# Patient Record
Sex: Female | Born: 1950 | Race: White | Hispanic: No | Marital: Married | Smoking: Never smoker
Health system: Southern US, Community
[De-identification: ages and names within clinical notes are randomized; demographics above are authoritative.]

## PROBLEM LIST (undated history)

## (undated) DIAGNOSIS — G319 Degenerative disease of nervous system, unspecified: Secondary | ICD-10-CM

## (undated) DIAGNOSIS — H9190 Unspecified hearing loss, unspecified ear: Secondary | ICD-10-CM

## (undated) DIAGNOSIS — I728 Aneurysm of other specified arteries: Secondary | ICD-10-CM

## (undated) DIAGNOSIS — R413 Other amnesia: Secondary | ICD-10-CM

## (undated) DIAGNOSIS — Z87448 Personal history of other diseases of urinary system: Secondary | ICD-10-CM

## (undated) DIAGNOSIS — E7800 Pure hypercholesterolemia, unspecified: Secondary | ICD-10-CM

## (undated) DIAGNOSIS — H409 Unspecified glaucoma: Secondary | ICD-10-CM

## (undated) DIAGNOSIS — M8580 Other specified disorders of bone density and structure, unspecified site: Secondary | ICD-10-CM

## (undated) DIAGNOSIS — F419 Anxiety disorder, unspecified: Secondary | ICD-10-CM

## (undated) DIAGNOSIS — E559 Vitamin D deficiency, unspecified: Secondary | ICD-10-CM

## (undated) HISTORY — DX: Pure hypercholesterolemia, unspecified: E78.00

## (undated) HISTORY — PX: BREAST CYST ASPIRATION: SHX578

## (undated) HISTORY — DX: Anxiety disorder, unspecified: F41.9

## (undated) HISTORY — DX: Aneurysm of other specified arteries: I72.8

## (undated) HISTORY — DX: Other specified disorders of bone density and structure, unspecified site: M85.80

## (undated) HISTORY — PX: FOOT SURGERY: SHX648

## (undated) HISTORY — DX: Other amnesia: R41.3

## (undated) HISTORY — DX: Personal history of other diseases of urinary system: Z87.448

## (undated) HISTORY — DX: Vitamin D deficiency, unspecified: E55.9

## (undated) HISTORY — DX: Unspecified hearing loss, unspecified ear: H91.90

## (undated) HISTORY — DX: Degenerative disease of nervous system, unspecified: G31.9

## (undated) HISTORY — DX: Unspecified glaucoma: H40.9

---

## 1993-04-27 DIAGNOSIS — Z87448 Personal history of other diseases of urinary system: Secondary | ICD-10-CM

## 1993-04-27 HISTORY — DX: Personal history of other diseases of urinary system: Z87.448

## 1996-02-26 HISTORY — PX: BREAST BIOPSY: SHX20

## 1998-06-30 ENCOUNTER — Ambulatory Visit (HOSPITAL_COMMUNITY): Admission: RE | Admit: 1998-06-30 | Discharge: 1998-06-30 | Payer: Self-pay | Admitting: Obstetrics and Gynecology

## 1999-07-06 ENCOUNTER — Encounter: Payer: Self-pay | Admitting: Obstetrics and Gynecology

## 1999-07-06 ENCOUNTER — Ambulatory Visit (HOSPITAL_COMMUNITY): Admission: RE | Admit: 1999-07-06 | Discharge: 1999-07-06 | Payer: Self-pay | Admitting: Obstetrics and Gynecology

## 1999-11-23 ENCOUNTER — Other Ambulatory Visit: Admission: RE | Admit: 1999-11-23 | Discharge: 1999-11-23 | Payer: Self-pay | Admitting: Obstetrics and Gynecology

## 2000-07-25 ENCOUNTER — Encounter: Payer: Self-pay | Admitting: Obstetrics and Gynecology

## 2000-07-25 ENCOUNTER — Ambulatory Visit (HOSPITAL_COMMUNITY): Admission: RE | Admit: 2000-07-25 | Discharge: 2000-07-25 | Payer: Self-pay | Admitting: Obstetrics and Gynecology

## 2000-10-17 ENCOUNTER — Other Ambulatory Visit: Admission: RE | Admit: 2000-10-17 | Discharge: 2000-10-17 | Payer: Self-pay | Admitting: Obstetrics and Gynecology

## 2001-07-31 ENCOUNTER — Ambulatory Visit (HOSPITAL_COMMUNITY): Admission: RE | Admit: 2001-07-31 | Discharge: 2001-07-31 | Payer: Self-pay | Admitting: Obstetrics and Gynecology

## 2001-07-31 ENCOUNTER — Encounter: Payer: Self-pay | Admitting: Obstetrics and Gynecology

## 2001-10-09 ENCOUNTER — Other Ambulatory Visit: Admission: RE | Admit: 2001-10-09 | Discharge: 2001-10-09 | Payer: Self-pay | Admitting: Obstetrics and Gynecology

## 2002-07-30 ENCOUNTER — Ambulatory Visit (HOSPITAL_COMMUNITY): Admission: RE | Admit: 2002-07-30 | Discharge: 2002-07-30 | Payer: Self-pay | Admitting: Obstetrics and Gynecology

## 2002-07-30 ENCOUNTER — Encounter: Payer: Self-pay | Admitting: Obstetrics and Gynecology

## 2002-10-08 ENCOUNTER — Other Ambulatory Visit: Admission: RE | Admit: 2002-10-08 | Discharge: 2002-10-08 | Payer: Self-pay | Admitting: Obstetrics and Gynecology

## 2003-08-12 ENCOUNTER — Encounter: Payer: Self-pay | Admitting: Obstetrics and Gynecology

## 2003-08-12 ENCOUNTER — Ambulatory Visit (HOSPITAL_COMMUNITY): Admission: RE | Admit: 2003-08-12 | Discharge: 2003-08-12 | Payer: Self-pay | Admitting: Obstetrics and Gynecology

## 2003-10-14 ENCOUNTER — Other Ambulatory Visit: Admission: RE | Admit: 2003-10-14 | Discharge: 2003-10-14 | Payer: Self-pay | Admitting: Obstetrics and Gynecology

## 2004-08-17 ENCOUNTER — Ambulatory Visit (HOSPITAL_COMMUNITY): Admission: RE | Admit: 2004-08-17 | Discharge: 2004-08-17 | Payer: Self-pay | Admitting: Obstetrics and Gynecology

## 2004-10-19 ENCOUNTER — Other Ambulatory Visit: Admission: RE | Admit: 2004-10-19 | Discharge: 2004-10-19 | Payer: Self-pay | Admitting: *Deleted

## 2005-10-10 ENCOUNTER — Encounter: Admission: RE | Admit: 2005-10-10 | Discharge: 2005-10-10 | Payer: Self-pay | Admitting: Orthopedic Surgery

## 2006-01-10 ENCOUNTER — Other Ambulatory Visit: Admission: RE | Admit: 2006-01-10 | Discharge: 2006-01-10 | Payer: Self-pay | Admitting: Obstetrics and Gynecology

## 2006-05-15 ENCOUNTER — Ambulatory Visit (HOSPITAL_COMMUNITY): Admission: RE | Admit: 2006-05-15 | Discharge: 2006-05-15 | Payer: Self-pay | Admitting: Obstetrics and Gynecology

## 2007-01-23 ENCOUNTER — Other Ambulatory Visit: Admission: RE | Admit: 2007-01-23 | Discharge: 2007-01-23 | Payer: Self-pay | Admitting: Obstetrics and Gynecology

## 2007-06-26 ENCOUNTER — Encounter: Admission: RE | Admit: 2007-06-26 | Discharge: 2007-06-26 | Payer: Self-pay | Admitting: Obstetrics and Gynecology

## 2007-10-28 ENCOUNTER — Ambulatory Visit (HOSPITAL_BASED_OUTPATIENT_CLINIC_OR_DEPARTMENT_OTHER): Admission: RE | Admit: 2007-10-28 | Discharge: 2007-10-28 | Payer: Self-pay | Admitting: Orthopedic Surgery

## 2008-01-29 ENCOUNTER — Other Ambulatory Visit: Admission: RE | Admit: 2008-01-29 | Discharge: 2008-01-29 | Payer: Self-pay | Admitting: Obstetrics and Gynecology

## 2008-01-29 ENCOUNTER — Encounter: Admission: RE | Admit: 2008-01-29 | Discharge: 2008-01-29 | Payer: Self-pay | Admitting: Family Medicine

## 2008-07-01 ENCOUNTER — Encounter: Admission: RE | Admit: 2008-07-01 | Discharge: 2008-07-01 | Payer: Self-pay | Admitting: Obstetrics and Gynecology

## 2009-02-03 ENCOUNTER — Other Ambulatory Visit: Admission: RE | Admit: 2009-02-03 | Discharge: 2009-02-03 | Payer: Self-pay | Admitting: Obstetrics and Gynecology

## 2009-07-21 ENCOUNTER — Encounter: Admission: RE | Admit: 2009-07-21 | Discharge: 2009-07-21 | Payer: Self-pay | Admitting: Obstetrics and Gynecology

## 2009-09-29 ENCOUNTER — Encounter: Admission: RE | Admit: 2009-09-29 | Discharge: 2009-09-29 | Payer: Self-pay | Admitting: Specialist

## 2009-10-28 DIAGNOSIS — H9190 Unspecified hearing loss, unspecified ear: Secondary | ICD-10-CM

## 2009-10-28 HISTORY — DX: Unspecified hearing loss, unspecified ear: H91.90

## 2009-12-26 HISTORY — PX: TOTAL KNEE ARTHROPLASTY: SHX125

## 2009-12-29 ENCOUNTER — Encounter (INDEPENDENT_AMBULATORY_CARE_PROVIDER_SITE_OTHER): Payer: Self-pay | Admitting: Specialist

## 2009-12-29 ENCOUNTER — Inpatient Hospital Stay (HOSPITAL_COMMUNITY): Admission: RE | Admit: 2009-12-29 | Discharge: 2010-01-02 | Payer: Self-pay | Admitting: Specialist

## 2010-07-27 ENCOUNTER — Encounter: Admission: RE | Admit: 2010-07-27 | Discharge: 2010-07-27 | Payer: Self-pay | Admitting: Obstetrics and Gynecology

## 2010-11-17 ENCOUNTER — Encounter: Payer: Self-pay | Admitting: Obstetrics and Gynecology

## 2011-01-21 LAB — CROSSMATCH

## 2011-01-21 LAB — DIFFERENTIAL
Basophils Relative: 0 % (ref 0–1)
Eosinophils Relative: 1 % (ref 0–5)
Lymphocytes Relative: 34 % (ref 12–46)
Lymphs Abs: 2 10*3/uL (ref 0.7–4.0)
Monocytes Relative: 7 % (ref 3–12)
Neutro Abs: 3.4 10*3/uL (ref 1.7–7.7)

## 2011-01-21 LAB — CBC
HCT: 40 % (ref 36.0–46.0)
Hemoglobin: 10.4 g/dL — ABNORMAL LOW (ref 12.0–15.0)
Hemoglobin: 7.4 g/dL — ABNORMAL LOW (ref 12.0–15.0)
Hemoglobin: 8.8 g/dL — ABNORMAL LOW (ref 12.0–15.0)
Hemoglobin: 8.9 g/dL — ABNORMAL LOW (ref 12.0–15.0)
MCHC: 34.4 g/dL (ref 30.0–36.0)
MCV: 92.2 fL (ref 78.0–100.0)
MCV: 94.4 fL (ref 78.0–100.0)
MCV: 94.5 fL (ref 78.0–100.0)
MCV: 95.2 fL (ref 78.0–100.0)
MCV: 96.3 fL (ref 78.0–100.0)
Platelets: 148 10*3/uL — ABNORMAL LOW (ref 150–400)
Platelets: 162 10*3/uL (ref 150–400)
Platelets: 252 10*3/uL (ref 150–400)
RBC: 2.7 MIL/uL — ABNORMAL LOW (ref 3.87–5.11)
RBC: 3.24 MIL/uL — ABNORMAL LOW (ref 3.87–5.11)
RBC: 4.21 MIL/uL (ref 3.87–5.11)
RDW: 13.3 % (ref 11.5–15.5)
WBC: 5.9 10*3/uL (ref 4.0–10.5)
WBC: 8 10*3/uL (ref 4.0–10.5)
WBC: 8.4 10*3/uL (ref 4.0–10.5)

## 2011-01-21 LAB — BASIC METABOLIC PANEL
BUN: 17 mg/dL (ref 6–23)
BUN: 8 mg/dL (ref 6–23)
CO2: 29 mEq/L (ref 19–32)
Chloride: 100 mEq/L (ref 96–112)
Chloride: 104 mEq/L (ref 96–112)
GFR calc Af Amer: >60 mL/min (ref >60)
Glucose, Bld: 133 mg/dL — ABNORMAL HIGH (ref 70–99)
Potassium: 3.8 mEq/L (ref 3.5–5.1)
Potassium: 4.3 mEq/L (ref 3.5–5.1)
Sodium: 137 mEq/L (ref 135–145)

## 2011-01-21 LAB — COMPREHENSIVE METABOLIC PANEL
AST: 21 U/L (ref 0–37)
Albumin: 4.3 g/dL (ref 3.5–5.2)
Alkaline Phosphatase: 54 U/L (ref 39–117)
CO2: 30 mEq/L (ref 19–32)
Chloride: 105 mEq/L (ref 96–112)
Creatinine, Ser: 0.54 mg/dL (ref 0.4–1.2)
GFR calc Af Amer: >60 mL/min (ref >60)
GFR calc non Af Amer: >60 mL/min (ref >60)
Glucose, Bld: 98 mg/dL (ref 70–99)
Potassium: 5.4 mEq/L — ABNORMAL HIGH (ref 3.5–5.1)
Sodium: 141 mEq/L (ref 135–145)
Total Bilirubin: 0.6 mg/dL (ref 0.3–1.2)

## 2011-01-21 LAB — URINE MICROSCOPIC-ADD ON

## 2011-01-21 LAB — ABO/RH: ABO/RH(D): O NEG

## 2011-01-21 LAB — URINALYSIS, ROUTINE W REFLEX MICROSCOPIC: Nitrite: NEGATIVE

## 2011-03-12 NOTE — Op Note (Signed)
Rebecca [name], [name]                 ACCOUNT NO.:  [id number]   MEDICAL RECORD NO.:  [id number]          PATIENT TYPE:  AMB   LOCATION:  DSC                          FACILITY:  [facility]   PHYSICIAN:  Leonides Grills, M.D.     DATE OF BIRTH:  Jul 17, 1951   DATE OF PROCEDURE:  10/28/2007  DATE OF DISCHARGE:                               OPERATIVE REPORT   PREOPERATIVE DIAGNOSES:  1. Left first TMT joint arthritis.  2. Left tight gastroc.  3. Left second through fifth hammertoes.  4. Left second and third metatarsalgia.   POSTOPERATIVE DIAGNOSES:  1. Left first TMT joint arthritis.  2. Left tight gastroc.  3. Left second through fifth hammertoes.  4. Left second and third metatarsalgia.   OPERATION:  1. Left first TMT joint fusion.  2. Left local bone graft.  3. Stress x-rays left foot.  4. Left second and third Weil metatarsal shortening osteotomies.  5. Left gastroc slide.  6. Left second through fifth toes MTP joint dorsal capsulotomies with      collateral release.  7. Left second through fifth toes proximal phalanx head resection.  8. Left second through fifth toes FDL to proximal phalanx tendon      transfers.  9. Left second through fourth toes EDB to EDL tendon transfer.  10.Left fifth toe EDL  Z lengthening.   ANESTHESIA:  General.   SURGEON:  Leonides Grills MD.   ASSISTANT:  Evlyn Kanner, PA-C.   ESTIMATED BLOOD LOSS:  Minimal.   TOURNIQUET TIME:  Two hours.   COMPLICATIONS:  None.   DISPOSITION:  Stable to PR.   INDICATIONS:  This is a 60 year old female who has had rheumatoid  arthritis and longstanding left forefoot pain that is interfering with  her life to the point where she could not do what she wants to despite  orthotics and shoe modifications and pads.  She was consented for the  above procedure.  All risks which include infection, nerve or vessel  injury, nonunion, malunion, hardware rotation, hardware failure,  persistent pain, worse pain, prolonged  recovery, stiffness, arthritis of  neighboring joints were all explained, questions were encouraged and  answered.   OPERATION:  The patient was brought to the operating room, placed in  supine position after adequate general anesthesia was administered as  well as Ancef 1 gram IV piggyback.  The left lower extremity is then  prepped and draped in a sterile manner over a proximally placed thigh  tourniquet.  We started the procedure with a longitudinal incision over  the medial aspect of the gastrocnemius muscle tendinous junction.  Dissection was carried down through skin.  Hemostasis was obtained.  Fascia was opened in line with the incision.  Conjoined region was  developed to the gastroc and soleus.  Soft tissue was then elevated off  the posterior aspect of the gastrocnemius.  Sural nerves identified and  protected posteriorly.  The gastrocnemius was then released with a  curved Mayo scissors and __________  release tight gastroc.  The area  was copiously irrigated with normal saline.  Subcu was  closed with 3-0  Vicryl, skin was closed with 4-0 Monocryl subcuticular stitch.  Steri-  Strips were applied.  Limb was then gravity exsanguinated.  Tourniquet  was elevated to 290 mmHg.  A longitudinal incision midline over the  medial aspect of the left great toe MTP joint was then made.  Dissection  carried down through skin.  Hemostasis was obtained.  Neurovascular  structures were identified both superiorly and inferiorly and protected  throughout the case.  A simple longitudinal capsulotomy was then made.  The MTP joint was then entered.  There was minimal cartilage left. The  remaining cartilage remove with a curved corners osteotome curette.  Once this was done, the lateral capsule was then released with a curved  Beaver blade.  We were then able to reduce the toe in a more anatomical  position.  She had a previous Austin bunionectomy and Akin osteotomy  from her previous surgeries  and made alignment a little more difficult  but the surfaces were congruent.  We then placed multiple 2 mm drill  holes in either side of the joint.  Joint was then reduced to an  anatomic position of about 5 degrees of valgus and with the proximal  phalanx parallel to the plantar aspect of the weightbearing surface of  the foot.  This was verified under C-arm guidance in the AP and lateral  planes.  Once this was held in placed with a 2 mm K-wire, we then placed  a 3.5 mm fully threaded cortical lag screw using 3.5 and 2.5 mm drill  holes, respectively.  This had excellent purchase and maintenance of the  compression across the MTP joint.  We then removed the 2 K-wire and  replaced this with a 3.5 mm fully threaded cortical lag screw using a  3.5 and 2.5 mm holes, respectively.  This had excellent purchase and  maintenance of the correction, bone removed from spurs as well as the  drills and later on through the proximal phalanx head resections were  used as local bone graft and this was packed into the joint with a  stress strain relieving technique using a bur.  We then made a  longitudinal incision over the second toe dorsally.  Dissection was  carried down through skin.  Hemostasis was obtained.  EDB and EDL  tendons were identified.  The EDL tendon was tenotomized proximal medial  and brevis distal lateral and retracted out of harm's way.  An MTP joint  dorsal capsulotomy with collateral release was then performed with 15  blade scalpel, protecting the soft tissue both medial and laterally.  This had excellent release of the tight capsule dorsally.  We then  skeletonized the distal aspect of the proximal phalanx.  The head was  then removed with a rongeur followed by a bone cutter.  This cut was  made perpendicular to the long axis of the proximal phalanx.  We then  went back to the MTP joint, plantar flexed the toe exposing the second  metatarsal head.  We then performed a Weil  metatarsal shortening  osteotomy using stacked sagittal saw blades.  This cut was made parallel  to the plantar aspect of the foot.  The head was then translated  approximately 3-4 mm proximally and affixed with a 1.5 mm fully threaded  cortical set screw using 1.1 mm drill holes, respectively.  This was 12  mm in length and had excellent purchase and maintenance of the  correction.  Redundant bone ledge  dorsally was trimmed off with the  rongeur and the exposed bone surfaces were covered with bone wax.  The  area was copiously irrigated with normal saline.  Once this was done, we  went back to the PIP joint. A longitudinal incision was made in the  plantar plate.  The FDL tendon was then identified as distal as possible  and tenotomized.  We then made a drill hole using a 2.5 and 3.5 mm drill  in the base of the proximal phalanx.  We then transferred the FDL  through the drill hole from plantar to dorsal using 3-0 PDS stitches,  had excellent transfer.  We then placed a 0.045 K-wire antegrade through  middle and distal phalanx, reduced PIP joint and fired this retrograde  with the MTP joint held in reduced position and tension applied on the  FDL tendon through the drill hole with the ankle in neutral dorsiflexion  and the toe slightly plantar flex.  With the K-wire holding the toe in a  anatomic position, we then completed the EDB to EDL tendon transfer  dorsally using 3-0 PDS stitch.  This was also sewn to the stump of the  FDL tendon recreating the extensor expansion unit.  The same exact  procedure was performed for the third toe as described for the second.  For the fourth toe, we did the same exact procedure, however, we did not  do the Weil osteotomy and we did a loop technique instead of a drill  hole technique for transferring the FDL to proximal phalanx tendon.  This was done due to the fact that the proximal phalanx had a very  narrow diameter compared to the diameter of the  FDL tendon.  We, for the  fifth toe, did the exact same technique as the previous toes described,  except we did not do a Weil osteotomy and we did a EDL Z lengthening  where the EDL was dissected out and with a 15 blade scalpel this was cut  in a Z manner, creating a Z shortening and reconstructed with 3-0 PDS  stitch.  At the end of the procedure, the tourniquet was deflated and  hemostasis was obtained.  All toes pinked up nicely. An 11 blade was  then used to create skin relieving incisions on either side of the K-  wire.  K-wire was bent, cut and capped.  Capsule was closed medially  over the great toe with a 2-0 Vicryl after the area was copiously  irrigated with normal saline.  Subcu was closed with 3-0 Vicryl over all  wounds.  Skin was closed 4-0 nylon over all wounds.  Sterile dressing  was applied.  Light modified Jones dressing was applied.  With the ankle  in neutral dorsiflexion, patient stable to PR. The gastroc slide area  was copiously irrigated with normal saline.  Subcu was closed with 3-0  Vicryl, the skin was closed with 4-0 Monocryl subcuticular stitch.  Steri-Strips were applied.      Leonides Grills, M.D.  Electronically Signed     PB/MEDQ  D:  10/28/2007  T:  10/28/2007  Job:  [id number]

## 2011-07-03 ENCOUNTER — Other Ambulatory Visit: Payer: Self-pay | Admitting: Obstetrics and Gynecology

## 2011-07-03 DIAGNOSIS — Z1231 Encounter for screening mammogram for malignant neoplasm of breast: Secondary | ICD-10-CM

## 2011-08-02 LAB — POCT HEMOGLOBIN-HEMACUE
Hemoglobin: 11.7 — ABNORMAL LOW
Operator id: 117931

## 2011-08-09 ENCOUNTER — Ambulatory Visit: Payer: Self-pay

## 2011-08-16 ENCOUNTER — Ambulatory Visit
Admission: RE | Admit: 2011-08-16 | Discharge: 2011-08-16 | Disposition: A | Discharge status: Home / Self Care | Payer: BC Managed Care – PPO | Source: Ambulatory Visit | Attending: Obstetrics and Gynecology | Admitting: Obstetrics and Gynecology

## 2011-08-16 DIAGNOSIS — Z1231 Encounter for screening mammogram for malignant neoplasm of breast: Secondary | ICD-10-CM

## 2012-02-13 ENCOUNTER — Other Ambulatory Visit: Payer: Self-pay | Admitting: Family Medicine

## 2012-02-13 DIAGNOSIS — Z780 Asymptomatic menopausal state: Secondary | ICD-10-CM

## 2012-03-20 ENCOUNTER — Ambulatory Visit
Admission: RE | Admit: 2012-03-20 | Discharge: 2012-03-20 | Disposition: A | Discharge status: Home / Self Care | Payer: BC Managed Care – PPO | Source: Ambulatory Visit | Attending: Family Medicine | Admitting: Family Medicine

## 2012-03-20 DIAGNOSIS — Z780 Asymptomatic menopausal state: Secondary | ICD-10-CM

## 2012-07-10 ENCOUNTER — Other Ambulatory Visit: Payer: Self-pay | Admitting: Obstetrics and Gynecology

## 2012-07-10 DIAGNOSIS — Z1231 Encounter for screening mammogram for malignant neoplasm of breast: Secondary | ICD-10-CM

## 2012-08-28 ENCOUNTER — Ambulatory Visit: Payer: BC Managed Care – PPO

## 2012-09-23 ENCOUNTER — Ambulatory Visit
Admission: RE | Admit: 2012-09-23 | Discharge: 2012-09-23 | Disposition: A | Discharge status: Home / Self Care | Payer: BC Managed Care – PPO | Source: Ambulatory Visit | Attending: Obstetrics and Gynecology | Admitting: Obstetrics and Gynecology

## 2012-09-23 DIAGNOSIS — Z1231 Encounter for screening mammogram for malignant neoplasm of breast: Secondary | ICD-10-CM

## 2013-07-11 HISTORY — PX: OTHER SURGICAL HISTORY: SHX169

## 2013-07-12 ENCOUNTER — Emergency Department (HOSPITAL_COMMUNITY): Payer: BC Managed Care – PPO

## 2013-07-12 ENCOUNTER — Encounter (HOSPITAL_COMMUNITY): Payer: Self-pay | Admitting: Emergency Medicine

## 2013-07-12 ENCOUNTER — Emergency Department (HOSPITAL_COMMUNITY)
Admission: EM | Admit: 2013-07-12 | Discharge: 2013-07-12 | Disposition: A | Discharge status: Home / Self Care | Payer: BC Managed Care – PPO | Attending: Emergency Medicine | Admitting: Emergency Medicine

## 2013-07-12 DIAGNOSIS — Z880 Allergy status to penicillin: Secondary | ICD-10-CM | POA: Insufficient documentation

## 2013-07-12 DIAGNOSIS — Z79899 Other long term (current) drug therapy: Secondary | ICD-10-CM | POA: Insufficient documentation

## 2013-07-12 DIAGNOSIS — S2220XA Unspecified fracture of sternum, initial encounter for closed fracture: Secondary | ICD-10-CM

## 2013-07-12 DIAGNOSIS — Y92410 Unspecified street and highway as the place of occurrence of the external cause: Secondary | ICD-10-CM | POA: Insufficient documentation

## 2013-07-12 DIAGNOSIS — Y9389 Activity, other specified: Secondary | ICD-10-CM | POA: Insufficient documentation

## 2013-07-12 DIAGNOSIS — I728 Aneurysm of other specified arteries: Secondary | ICD-10-CM

## 2013-07-12 LAB — URINALYSIS, ROUTINE W REFLEX MICROSCOPIC
Glucose, UA: NEGATIVE mg/dL
Ketones, ur: NEGATIVE mg/dL
Leukocytes, UA: NEGATIVE
pH: 6 (ref 5.0–8.0)

## 2013-07-12 LAB — BASIC METABOLIC PANEL
CO2: 23 mEq/L (ref 19–32)
Calcium: 9.8 mg/dL (ref 8.4–10.5)
Chloride: 103 mEq/L (ref 96–112)
Creatinine, Ser: 0.55 mg/dL (ref 0.50–1.10)
Glucose, Bld: 110 mg/dL — ABNORMAL HIGH (ref 70–99)

## 2013-07-12 LAB — CBC
Hemoglobin: 13.6 g/dL (ref 12.0–15.0)
MCH: 31.3 pg (ref 26.0–34.0)
MCV: 92.9 fL (ref 78.0–100.0)
Platelets: 229 10*3/uL (ref 150–400)
RBC: 4.35 MIL/uL (ref 3.87–5.11)
WBC: 12.1 10*3/uL — ABNORMAL HIGH (ref 4.0–10.5)

## 2013-07-12 LAB — URINE MICROSCOPIC-ADD ON

## 2013-07-12 MED ORDER — ACETAMINOPHEN 325 MG PO TABS
650.0000 mg | ORAL_TABLET | Freq: Once | ORAL | Status: AC
  Administered 2013-07-12: 650 mg via ORAL
  Filled 2013-07-12: qty 2

## 2013-07-12 MED ORDER — HYDROCODONE-ACETAMINOPHEN 5-325 MG PO TABS: 2.0000 | ORAL_TABLET | ORAL | Status: DC | PRN

## 2013-07-12 MED ORDER — IOHEXOL 300 MG/ML  SOLN
100.0000 mL | Freq: Once | INTRAMUSCULAR | Status: AC | PRN
  Administered 2013-07-12: 100 mL via INTRAVENOUS

## 2013-07-12 MED ORDER — ONDANSETRON HCL 4 MG PO TABS: 4.0000 mg | ORAL_TABLET | Freq: Four times a day (QID) | ORAL | Status: DC
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
## 2013-07-22 ENCOUNTER — Encounter: Payer: Self-pay | Admitting: Obstetrics and Gynecology

## 2013-07-23 ENCOUNTER — Ambulatory Visit: Payer: Self-pay | Admitting: Obstetrics and Gynecology

## 2013-07-26 ENCOUNTER — Telehealth: Payer: Self-pay | Admitting: Obstetrics and Gynecology

## 2013-07-26 MED ORDER — VITAMIN D (ERGOCALCIFEROL) 1.25 MG (50000 UNIT) PO CAPS: 50000.0000 [IU] | ORAL_CAPSULE | ORAL | Status: DC

## 2013-07-26 NOTE — Telephone Encounter (Signed)
Spoke to patient & let patient know that we could only send 1 mth of vitamin d to the pharmacy since her last aex was 07/17/12 & she is now past due. Pt takes vitamin d 50,000iu 1 time a month. Pt aware she will only get 1 pill at pharmacy. Pt okay with this. I tried to get patient to schedule her aex again & pt declines to do so

## 2013-07-26 NOTE — Telephone Encounter (Signed)
Patient requesting refill for vitamin D please. Patient is aware she is due for an aex but is not ready to schedule yet.  [facility] [phone]

## 2013-08-06 ENCOUNTER — Other Ambulatory Visit: Payer: Self-pay

## 2013-08-06 DIAGNOSIS — Z1231 Encounter for screening mammogram for malignant neoplasm of breast: Secondary | ICD-10-CM

## 2013-08-12 ENCOUNTER — Ambulatory Visit (INDEPENDENT_AMBULATORY_CARE_PROVIDER_SITE_OTHER): Payer: BC Managed Care – PPO | Admitting: Obstetrics and Gynecology

## 2013-08-12 ENCOUNTER — Encounter: Payer: Self-pay | Admitting: Obstetrics and Gynecology

## 2013-08-12 VITALS — BP 124/68 | HR 80 | Ht 65.0 in | Wt 127.0 lb

## 2013-08-12 DIAGNOSIS — E559 Vitamin D deficiency, unspecified: Secondary | ICD-10-CM

## 2013-08-12 DIAGNOSIS — Z01419 Encounter for gynecological examination (general) (routine) without abnormal findings: Secondary | ICD-10-CM

## 2013-08-12 DIAGNOSIS — R3129 Other microscopic hematuria: Secondary | ICD-10-CM

## 2013-08-12 DIAGNOSIS — Z23 Encounter for immunization: Secondary | ICD-10-CM

## 2013-08-12 MED ORDER — VITAMIN D (ERGOCALCIFEROL) 1.25 MG (50000 UNIT) PO CAPS: 50000.0000 [IU] | ORAL_CAPSULE | ORAL | Status: DC

## 2013-08-12 NOTE — Patient Instructions (Signed)
EXERCISE AND DIET:  We recommended that you start or continue a regular exercise program for good health. Regular exercise means any activity that makes your heart beat faster and makes you sweat.  We recommend exercising at least 30 minutes per day at least 3 days a week, preferably 4 or 5.  We also recommend a diet low in fat and sugar.  Inactivity, poor dietary choices and obesity can cause diabetes, heart attack, stroke, and kidney damage, among others.    ALCOHOL AND SMOKING:  Women should limit their alcohol intake to no more than 7 drinks/beers/glasses of wine (combined, not each!) per week. Moderation of alcohol intake to this level decreases your risk of breast cancer and liver damage. And of course, no recreational drugs are part of a healthy lifestyle.  And absolutely no smoking or even second hand smoke. Most people know smoking can cause heart and lung diseases, but did you know it also contributes to weakening of your bones? Aging of your skin?  Yellowing of your teeth and nails?  CALCIUM AND VITAMIN D:  Adequate intake of calcium and Vitamin D are recommended.  The recommendations for exact amounts of these supplements seem to change often, but generally speaking 600 mg of calcium (either carbonate or citrate) and 800 units of Vitamin D per day seems prudent. Certain women may benefit from higher intake of Vitamin D.  If you are among these women, your doctor will have told you during your visit.    PAP SMEARS:  Pap smears, to check for cervical cancer or precancers,  have traditionally been done yearly, although recent scientific advances have shown that most women can have pap smears less often.  However, every woman still should have a physical exam from her gynecologist every year. It will include a breast check, inspection of the vulva and vagina to check for abnormal growths or skin changes, a visual exam of the cervix, and then an exam to evaluate the size and shape of the uterus and  ovaries.  And after 62 years of age, a rectal exam is indicated to check for rectal cancers. We will also provide age appropriate advice regarding health maintenance, like when you should have certain vaccines, screening for sexually transmitted diseases, bone density testing, colonoscopy, mammograms, etc.   MAMMOGRAMS:  All women over 40 years old should have a yearly mammogram. Many facilities now offer a "3D" mammogram, which may cost around $50 extra out of pocket. If possible,  we recommend you accept the option to have the 3D mammogram performed.  It both reduces the number of women who will be called back for extra views which then turn out to be normal, and it is better than the routine mammogram at detecting truly abnormal areas.    COLONOSCOPY:  Colonoscopy to screen for colon cancer is recommended for all women at age 50.  We know, you hate the idea of the prep.  We agree, BUT, having colon cancer and not knowing it is worse!!  Colon cancer so often starts as a polyp that can be seen and removed at colonscopy, which can quite literally save your life!  And if your first colonoscopy is normal and you have no family history of colon cancer, most women don't have to have it again for 10 years.  Once every ten years, you can do something that may end up saving your life, right?  We will be happy to help you get it scheduled when you are ready.    Be sure to check your insurance coverage so you understand how much it will cost.  It may be covered as a preventative service at no cost, but you should check your particular policy.    Tetanus, Diphtheria, Pertussis (Tdap) Vaccine What You Need to Know WHY GET VACCINATED? Tetanus, diphtheria and pertussis can be very serious diseases, even for adolescents and adults. Tdap vaccine can protect us from these diseases. TETANUS (Lockjaw) causes painful muscle tightening and stiffness, usually all over the body.  It can lead to tightening of muscles in the head  and neck so you can't open your mouth, swallow, or sometimes even breathe. Tetanus kills about 1 out of 5 people who are infected. DIPHTHERIA can cause a thick coating to form in the back of the throat.  It can lead to breathing problems, paralysis, heart failure, and death. PERTUSSIS (Whooping Cough) causes severe coughing spells, which can cause difficulty breathing, vomiting and disturbed sleep.  It can also lead to weight loss, incontinence, and rib fractures. Up to 2 in 100 adolescents and 5 in 100 adults with pertussis are hospitalized or have complications, which could include pneumonia and death. These diseases are caused by bacteria. Diphtheria and pertussis are spread from person to person through coughing or sneezing. Tetanus enters the body through cuts, scratches, or wounds. Before vaccines, the [place] saw as many as 200,000 cases a year of diphtheria and pertussis, and hundreds of cases of tetanus. Since vaccination began, tetanus and diphtheria have dropped by about 99% and pertussis by about 80%. TDAP VACCINE Tdap vaccine can protect adolescents and adults from tetanus, diphtheria, and pertussis. One dose of Tdap is routinely given at age 11 or 12. People who did not get Tdap at that age should get it as soon as possible. Tdap is especially important for health care professionals and anyone having close contact with a baby younger than 12 months. Pregnant women should get a dose of Tdap during every pregnancy, to protect the newborn from pertussis. Infants are most at risk for severe, life-threatening complications from pertussis. A similar vaccine, called Td, protects from tetanus and diphtheria, but not pertussis. A Td booster should be given every 10 years. Tdap may be given as one of these boosters if you have not already gotten a dose. Tdap may also be given after a severe cut or burn to prevent tetanus infection. Your doctor can give you more information. Tdap may safely  be given at the same time as other vaccines. SOME PEOPLE SHOULD NOT GET THIS VACCINE  If you ever had a life-threatening allergic reaction after a dose of any tetanus, diphtheria, or pertussis containing vaccine, OR if you have a severe allergy to any part of this vaccine, you should not get Tdap. Tell your doctor if you have any severe allergies.  If you had a coma, or long or multiple seizures within 7 days after a childhood dose of DTP or DTaP, you should not get Tdap, unless a cause other than the vaccine was found. You can still get Td.  Talk to your doctor if you:  have epilepsy or another nervous system problem,  had severe pain or swelling after any vaccine containing diphtheria, tetanus or pertussis,  ever had Guillain-Barr Syndrome (GBS),  aren't feeling well on the day the shot is scheduled. RISKS OF A VACCINE REACTION With any medicine, including vaccines, there is a chance of side effects. These are usually mild and go away on their own, but serious   reactions are also possible. Brief fainting spells can follow a vaccination, leading to injuries from falling. Sitting or lying down for about 15 minutes can help prevent these. Tell your doctor if you feel dizzy or light-headed, or have vision changes or ringing in the ears. Mild problems following Tdap (Did not interfere with activities)  Pain where the shot was given (about 3 in 4 adolescents or 2 in 3 adults)  Redness or swelling where the shot was given (about 1 person in 5)  Mild fever of at least 100.4F (up to about 1 in 25 adolescents or 1 in 100 adults)  Headache (about 3 or 4 people in 10)  Tiredness (about 1 person in 3 or 4)  Nausea, vomiting, diarrhea, stomach ache (up to 1 in 4 adolescents or 1 in 10 adults)  Chills, body aches, sore joints, rash, swollen glands (uncommon) Moderate problems following Tdap (Interfered with activities, but did not require medical attention)  Pain where the shot was given  (about 1 in 5 adolescents or 1 in 100 adults)  Redness or swelling where the shot was given (up to about 1 in 16 adolescents or 1 in 25 adults)  Fever over 102F (about 1 in 100 adolescents or 1 in 250 adults)  Headache (about 3 in 20 adolescents or 1 in 10 adults)  Nausea, vomiting, diarrhea, stomach ache (up to 1 or 3 people in 100)  Swelling of the entire arm where the shot was given (up to about 3 in 100). Severe problems following Tdap (Unable to perform usual activities, required medical attention)  Swelling, severe pain, bleeding and redness in the arm where the shot was given (rare). A severe allergic reaction could occur after any vaccine (estimated less than 1 in a million doses). WHAT IF THERE IS A SERIOUS REACTION? What should I look for?  Look for anything that concerns you, such as signs of a severe allergic reaction, very high fever, or behavior changes. Signs of a severe allergic reaction can include hives, swelling of the face and throat, difficulty breathing, a fast heartbeat, dizziness, and weakness. These would start a few minutes to a few hours after the vaccination. What should I do?  If you think it is a severe allergic reaction or other emergency that can't wait, call 9-1-1 or get the person to the nearest hospital. Otherwise, call your doctor.  Afterward, the reaction should be reported to the "Vaccine Adverse Event Reporting System" (VAERS). Your doctor might file this report, or you can do it yourself through the VAERS web site at www.vaers.hhs.gov, or by calling 1-800-822-7967. VAERS is only for reporting reactions. They do not give medical advice.  THE NATIONAL VACCINE INJURY COMPENSATION PROGRAM The National Vaccine Injury Compensation Program (VICP) is a federal program that was created to compensate people who may have been injured by certain vaccines. Persons who believe they may have been injured by a vaccine can learn about the program and about filing a  claim by calling 1-800-338-2382 or visiting the VICP website at www.hrsa.gov/vaccinecompensation. HOW CAN I LEARN MORE?  Ask your doctor.  Call your local or state health department.  Contact the Centers for Disease Control and Prevention (CDC):  Call 1-800-232-4636 or visit CDC's website at www.cdc.gov/vaccines. CDC Tdap Vaccine VIS (03/05/12) Document Released: 04/14/2012 Document Revised: 07/08/2012 Document Reviewed: 04/14/2012 [employer] Patient Information 2014 [employer].  
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
## 2013-08-13 ENCOUNTER — Ambulatory Visit: Payer: Self-pay | Admitting: Obstetrics and Gynecology

## 2013-09-24 ENCOUNTER — Ambulatory Visit
Admission: RE | Admit: 2013-09-24 | Discharge: 2013-09-24 | Disposition: A | Discharge status: Home / Self Care | Payer: BC Managed Care – PPO | Source: Ambulatory Visit

## 2013-09-24 DIAGNOSIS — Z1231 Encounter for screening mammogram for malignant neoplasm of breast: Secondary | ICD-10-CM

## 2014-02-04 ENCOUNTER — Other Ambulatory Visit: Payer: Self-pay | Admitting: Family Medicine

## 2014-02-04 DIAGNOSIS — M8580 Other specified disorders of bone density and structure, unspecified site: Secondary | ICD-10-CM

## 2014-03-25 ENCOUNTER — Ambulatory Visit
Admission: RE | Admit: 2014-03-25 | Discharge: 2014-03-25 | Disposition: A | Discharge status: Home / Self Care | Payer: BC Managed Care – PPO | Source: Ambulatory Visit | Attending: Family Medicine | Admitting: Family Medicine

## 2014-03-25 DIAGNOSIS — M8580 Other specified disorders of bone density and structure, unspecified site: Secondary | ICD-10-CM

## 2014-06-24 ENCOUNTER — Encounter: Payer: Self-pay | Admitting: Obstetrics and Gynecology

## 2014-08-19 ENCOUNTER — Encounter: Payer: Self-pay | Admitting: Obstetrics and Gynecology

## 2014-08-19 ENCOUNTER — Ambulatory Visit: Payer: BC Managed Care – PPO | Admitting: Obstetrics and Gynecology

## 2014-08-19 ENCOUNTER — Telehealth: Payer: Self-pay | Admitting: Obstetrics and Gynecology

## 2014-08-19 ENCOUNTER — Ambulatory Visit (INDEPENDENT_AMBULATORY_CARE_PROVIDER_SITE_OTHER): Payer: BC Managed Care – PPO | Admitting: Obstetrics and Gynecology

## 2014-08-19 VITALS — BP 122/70 | HR 68 | Ht 65.0 in | Wt 126.0 lb

## 2014-08-19 DIAGNOSIS — Z0000 Encounter for general adult medical examination without abnormal findings: Secondary | ICD-10-CM

## 2014-08-19 DIAGNOSIS — Z01419 Encounter for gynecological examination (general) (routine) without abnormal findings: Secondary | ICD-10-CM

## 2014-08-19 DIAGNOSIS — E559 Vitamin D deficiency, unspecified: Secondary | ICD-10-CM

## 2014-08-19 DIAGNOSIS — R8290 Unspecified abnormal findings in urine: Secondary | ICD-10-CM

## 2014-08-19 LAB — POCT URINALYSIS DIPSTICK
BILIRUBIN UA: NEGATIVE
Glucose, UA: NEGATIVE
KETONES UA: NEGATIVE
NITRITE UA: NEGATIVE
PH UA: 5
Protein, UA: NEGATIVE
Urobilinogen, UA: NEGATIVE

## 2014-08-19 NOTE — Telephone Encounter (Signed)
Pt wanted to give Dr Quincy Simmonds the name of the eye drops she use. They are Timlol, Durezol, and Zioptan. She would also like a prescription for VIt D sent to [facility] at [phone].
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
## 2014-08-19 NOTE — Patient Instructions (Signed)

## 2014-08-19 NOTE — Telephone Encounter (Signed)
Added Rxs to chart.   Last AEX: 08/19/14 Last Vitamin D refill: 08/12/13 #11 X 0  Please Advise

## 2014-08-19 NOTE — Addendum Note (Signed)
Addended by: Tacy Learn, BROOK E on: 08/19/2014 05:16 PM   Modules accepted: Orders

## 2014-08-20 NOTE — Telephone Encounter (Signed)
Rebecca [name],  Please have the patient return for a vit D level check. She asked for a refill on prescription vit D, and we have not checked a level to know if this is appropriate or not. I am declining the Rx for now.   Cc- Denyse Dago

## 2014-08-21 LAB — URINE CULTURE: Colony Count: 2000

## 2014-08-22 NOTE — Telephone Encounter (Signed)
Patient called stating she thinks she had vitamin D level drawn with Dr. Lisbeth Ply.  Advised if Dr. Lisbeth Ply had drawn vitamin d, then she could prescribe medication.  Patient states Dr. Lisbeth Ply will not prescribe for her.  Asked her to get a copy and send to Dr. Quincy Simmonds for her review.  If Dr. Lisbeth Ply did not draw, patient will return to our office and have drawn.

## 2014-08-22 NOTE — Telephone Encounter (Signed)
Patient called stating she spoke with Dr. Onnie Boer office and Vitamin D level drawn in their office and it was low, 37.  Patient states needs Vitamin D Rx called in.  Advised patient Dr. Quincy Simmonds will need a copy of this lab if Dr. Lisbeth Ply is not going to treat for Vit D Deficiency.  She states Dr. Lisbeth Ply will not treat and will have their office send [place] a copy.

## 2014-08-22 NOTE — Telephone Encounter (Signed)
Called patient at [phone] [facility] for patient to call me.  Patient needs to come to office for a Vit D level before we can fill Vit D Rx (see Dr. Elza Rafter note).

## 2014-08-22 NOTE — Telephone Encounter (Signed)
Pt is calling amanda to let her know that dr [facility] office is going to send the copy of her blood work to dr Quincy Simmonds.

## 2014-08-24 LAB — IPS PAP TEST WITH HPV

## 2014-08-25 NOTE — Telephone Encounter (Signed)
I have not received the patient's lab results for the vitamin D level. Her vit D level per her report is actually in a normal range.  I recommend OTC vit D 1000 IU daily.

## 2014-08-25 NOTE — Telephone Encounter (Signed)
Patient notified per Dr. Quincy Simmonds, we still haven't seen Vit D level from Dr. Onnie Boer office but if Vit D level 37 it is actually in normal range.  Advised patient to take OTC 1000 IU daily.  Advised patient if has Vit D level checked next year to let  Dr. Quincy Simmonds check it.  Patient agreed.

## 2014-09-01 ENCOUNTER — Other Ambulatory Visit: Payer: Self-pay

## 2014-09-01 DIAGNOSIS — Z1231 Encounter for screening mammogram for malignant neoplasm of breast: Secondary | ICD-10-CM

## 2014-09-02 ENCOUNTER — Other Ambulatory Visit: Payer: Self-pay | Admitting: Family Medicine

## 2014-09-02 DIAGNOSIS — I728 Aneurysm of other specified arteries: Secondary | ICD-10-CM

## 2014-09-21 ENCOUNTER — Inpatient Hospital Stay: Admission: RE | Admit: 2014-09-21 | Payer: BC Managed Care – PPO | Source: Ambulatory Visit

## 2014-09-30 ENCOUNTER — Ambulatory Visit
Admission: RE | Admit: 2014-09-30 | Discharge: 2014-09-30 | Disposition: A | Discharge status: Home / Self Care | Payer: BC Managed Care – PPO | Source: Ambulatory Visit | Attending: Family Medicine | Admitting: Family Medicine

## 2014-09-30 DIAGNOSIS — I728 Aneurysm of other specified arteries: Secondary | ICD-10-CM

## 2014-09-30 MED ORDER — IOHEXOL 350 MG/ML SOLN
80.0000 mL | Freq: Once | INTRAVENOUS | Status: AC | PRN
  Administered 2014-09-30: 80 mL via INTRAVENOUS

## 2014-10-06 ENCOUNTER — Ambulatory Visit
Admission: RE | Admit: 2014-10-06 | Discharge: 2014-10-06 | Disposition: A | Discharge status: Home / Self Care | Payer: BC Managed Care – PPO | Source: Ambulatory Visit

## 2014-10-06 ENCOUNTER — Other Ambulatory Visit: Payer: BC Managed Care – PPO

## 2014-10-06 ENCOUNTER — Encounter (INDEPENDENT_AMBULATORY_CARE_PROVIDER_SITE_OTHER): Payer: Self-pay

## 2014-10-06 ENCOUNTER — Ambulatory Visit: Payer: BC Managed Care – PPO

## 2014-10-06 DIAGNOSIS — Z1231 Encounter for screening mammogram for malignant neoplasm of breast: Secondary | ICD-10-CM

## 2015-01-16 ENCOUNTER — Telehealth: Payer: Self-pay | Admitting: Obstetrics and Gynecology

## 2015-01-16 NOTE — Telephone Encounter (Signed)
Patient says "I am having trouble sleeping and need something mild for my nerves". Confirmed pharmacy on file. Last seen 08/19/14.

## 2015-01-16 NOTE — Telephone Encounter (Signed)
I agree with the recommendation for an office visit to evaluate for medication for insomnia/anxiety.  I have closed the encounter as patient has declined an office visit.

## 2015-01-16 NOTE — Telephone Encounter (Signed)
Spoke with patient. She states she is having some "problems with my nerves." States this has been ongoing. Denies any concerns regarding depression, thoughts of harm to self or anyone else.   She states that she saw her PCP and "they wouldn't give me anything." Advised patient that I would recommended following up with PCP as Dr. Quincy Simmonds is Gynecologist and typically does not place prescriptions for sleep or ongoing anxiety issues, however, can schedule office visit to come in and discuss with Dr. Quincy Simmonds. Patient declines. States "I'm not going to come all the way to [place] to do that." Advised patient to please call back if she would like office visit to discuss. Patient agreeable.   Routing to provider for final review. Patient agreeable to disposition. Will close encounter

## 2015-04-25 ENCOUNTER — Ambulatory Visit (INDEPENDENT_AMBULATORY_CARE_PROVIDER_SITE_OTHER): Payer: BLUE CROSS/BLUE SHIELD

## 2015-04-25 ENCOUNTER — Ambulatory Visit (INDEPENDENT_AMBULATORY_CARE_PROVIDER_SITE_OTHER): Payer: BLUE CROSS/BLUE SHIELD | Admitting: Podiatry

## 2015-04-25 ENCOUNTER — Encounter: Payer: Self-pay | Admitting: Podiatry

## 2015-04-25 VITALS — BP 129/78 | HR 72 | Resp 16 | Ht 65.0 in | Wt 116.0 lb

## 2015-04-25 DIAGNOSIS — Q828 Other specified congenital malformations of skin: Secondary | ICD-10-CM | POA: Diagnosis not present

## 2015-04-25 DIAGNOSIS — M2040 Other hammer toe(s) (acquired), unspecified foot: Secondary | ICD-10-CM | POA: Diagnosis not present

## 2015-04-25 DIAGNOSIS — M71571 Other bursitis, not elsewhere classified, right ankle and foot: Secondary | ICD-10-CM | POA: Diagnosis not present

## 2015-04-25 DIAGNOSIS — M7751 Other enthesopathy of right foot: Secondary | ICD-10-CM

## 2015-04-25 NOTE — Progress Notes (Signed)
Subjective:    Patient ID: Rebecca [name], female    DOB: 03/23/1951, 64 y.o.   MRN: [id number]  HPI Comments: "I have this thing on the side of my foot"  Patient c/o tender lateral foot right for about 6 months. There is a large, thick callused area. Some redness. She has tried several OTC meds, creams-no help.  Also, concerned about toes bilateral. She had hammer toe surgery years ago and the toes have drifted back on top of each other.     Review of Systems  HENT: Positive for hearing loss.   Eyes: Positive for visual disturbance.  Musculoskeletal: Positive for arthralgias and gait problem.  All other systems reviewed and are negative.      Objective:   Physical Exam: I have reviewed her past medical history medications allergies surgery social history and review of systems. Pulses are palpable bilateral. Capillary fill time is immediate. Neurologic sensorium is intact deep tendon reflexes are brisk and equal bilateral. Muscle strength equal bilateral. Deep tendon reflexes are equal orthopedic evaluation demonstrates a rectus foot very narrow. Hammertoe deformities previously surgically corrected resulting in an overlapping fourth digit right foot overlapping fifth digit left foot. She also has had a history of a fractured fifth metatarsal which is resulted in plantarflexion and bone hypertrophy to the fifth met base. 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 The lesion measures just shy of 1 cm in diameter. There is no underlying blood or infection.        Assessment & Plan:  Assessment: Hammertoe deformities bilateral. Bursitis capsulitis. Metatarsal base right history of fractured fifth that base right. Porokeratosis fifth met base right.  Plan: Discussed etiology pathology conservative versus surgical therapies. Injected the area today with dexamethasone and local and aesthetic. Debris the area of reactive  hyperkeratosis and placed padding. Discussed the possible need for amputation of the overlapping digits. I will follow-up with her in a few weeks. 

## 2015-05-19 DIAGNOSIS — M2040 Other hammer toe(s) (acquired), unspecified foot: Secondary | ICD-10-CM

## 2015-05-29 HISTORY — PX: KNEE SURGERY: SHX244

## 2015-07-27 ENCOUNTER — Ambulatory Visit: Payer: BLUE CROSS/BLUE SHIELD | Admitting: Podiatry

## 2015-08-25 ENCOUNTER — Encounter: Payer: Self-pay | Admitting: Obstetrics and Gynecology

## 2015-08-25 ENCOUNTER — Ambulatory Visit (INDEPENDENT_AMBULATORY_CARE_PROVIDER_SITE_OTHER): Payer: BLUE CROSS/BLUE SHIELD | Admitting: Obstetrics and Gynecology

## 2015-08-25 VITALS — BP 118/72 | HR 90 | Resp 20 | Ht 66.0 in | Wt 116.8 lb

## 2015-08-25 DIAGNOSIS — Z01419 Encounter for gynecological examination (general) (routine) without abnormal findings: Secondary | ICD-10-CM | POA: Diagnosis not present

## 2015-08-25 DIAGNOSIS — Z0000 Encounter for general adult medical examination without abnormal findings: Secondary | ICD-10-CM | POA: Diagnosis not present

## 2015-08-25 NOTE — Patient Instructions (Signed)
EXERCISE AND DIET:  We recommended that you start or continue a regular exercise program for good health. Regular exercise means any activity that makes your heart beat faster and makes you sweat.  We recommend exercising at least 30 minutes per day at least 3 days a week, preferably 4 or 5.  We also recommend a diet low in fat and sugar.  Inactivity, poor dietary choices and obesity can cause diabetes, heart attack, stroke, and kidney damage, among others.    ALCOHOL AND SMOKING:  Women should limit their alcohol intake to no more than 7 drinks/beers/glasses of wine (combined, not each!) per week. Moderation of alcohol intake to this level decreases your risk of breast cancer and liver damage. And of course, no recreational drugs are part of a healthy lifestyle.  And absolutely no smoking or even second hand smoke. Most people know smoking can cause heart and lung diseases, but did you know it also contributes to weakening of your bones? Aging of your skin?  Yellowing of your teeth and nails?  CALCIUM AND VITAMIN D:  Adequate intake of calcium and Vitamin D are recommended.  The recommendations for exact amounts of these supplements seem to change often, but generally speaking 600 mg of calcium (either carbonate or citrate) and 800 units of Vitamin D per day seems prudent. Certain women may benefit from higher intake of Vitamin D.  If you are among these women, your doctor will have told you during your visit.    PAP SMEARS:  Pap smears, to check for cervical cancer or precancers,  have traditionally been done yearly, although recent scientific advances have shown that most women can have pap smears less often.  However, every woman still should have a physical exam from her gynecologist every year. It will include a breast check, inspection of the vulva and vagina to check for abnormal growths or skin changes, a visual exam of the cervix, and then an exam to evaluate the size and shape of the uterus and  ovaries.  And after 64 years of age, a rectal exam is indicated to check for rectal cancers. We will also provide age appropriate advice regarding health maintenance, like when you should have certain vaccines, screening for sexually transmitted diseases, bone density testing, colonoscopy, mammograms, etc.   MAMMOGRAMS:  All women over 40 years old should have a yearly mammogram. Many facilities now offer a "3D" mammogram, which may cost around $50 extra out of pocket. If possible,  we recommend you accept the option to have the 3D mammogram performed.  It both reduces the number of women who will be called back for extra views which then turn out to be normal, and it is better than the routine mammogram at detecting truly abnormal areas.    COLONOSCOPY:  Colonoscopy to screen for colon cancer is recommended for all women at age 50.  We know, you hate the idea of the prep.  We agree, BUT, having colon cancer and not knowing it is worse!!  Colon cancer so often starts as a polyp that can be seen and removed at colonscopy, which can quite literally save your life!  And if your first colonoscopy is normal and you have no family history of colon cancer, most women don't have to have it again for 10 years.  Once every ten years, you can do something that may end up saving your life, right?  We will be happy to help you get it scheduled when you are ready.    Be sure to check your insurance coverage so you understand how much it will cost.  It may be covered as a preventative service at no cost, but you should check your particular policy.     Menopause and Herbal Products WHAT IS MENOPAUSE? Menopause is the normal time of life when menstrual periods decrease in frequency and eventually stop completely. This process can take several years for some women. Menopause is complete when you have had an absence of menstruation for a full year since your last menstrual period. It usually occurs between the ages of 53 and  51. It is not common for menopause to begin before the age of 22. During menopause, your body stops producing the female hormones estrogen and progesterone. Common symptoms associated with this loss of hormones (vasomotor symptoms) are:  Hot flashes.  Hot flushes.  Night sweats. Other common symptoms and complications of menopause include:  Decrease in sex drive.  Vaginal dryness and thinning of the walls of the vagina. This can make sex painful.  Dryness of the skin and development of wrinkles.  Headaches.  Tiredness.  Irritability.  Memory problems.  Weight gain.  Bladder infections.  Hair growth on the face and chest.  Inability to reproduce offspring (infertility).  Loss of density in the bones (osteoporosis) increasing your risk for breaks (fractures).  Depression.  Hardening and narrowing of the arteries (atherosclerosis). This increases your risk of heart attack and stroke. WHAT TREATMENT OPTIONS ARE AVAILABLE? There are many treatment choices for menopause symptoms. The most common treatment is hormone replacement therapy. Many alternative therapies for menopause are emerging, including the use of herbal products. These supplements can be found in the form of herbs, teas, oils, tinctures, and pills. Common herbal supplements for menopause are made from plants that contain phytoestrogens. Phytoestrogens are compounds that occur naturally in plants and plant products. They act like estrogen in the body. Foods and herbs that contain phytoestrogens include:  Soy.  Flax seeds.  Red clover.  Ginseng. WHAT MENOPAUSE SYMPTOMS MAY BE HELPED IF I USE HERBAL PRODUCTS?  Vasomotor symptoms. These may be helped by:  Soy. Some studies show that soy may have a moderate benefit for hot flashes.  Black cohosh. There is limited evidence indicating this may be beneficial for hot flashes.  Symptoms that are related to heart and blood vessel disease. These may be helped by  soy. Studies have shown that soy can help to lower cholesterol.  Depression. This may be helped by:  St. John's wort. There is limited evidence that shows this may help mild to moderate depression.  Black cohosh. There is evidence that this may help depression and mood swings.  Osteoporosis. Soy may help to decrease bone loss that is associated with menopause and may prevent osteoporosis. Limited evidence indicates that red clover may offer some bone loss protection as well. Other herbal products that are commonly used during menopause lack enough evidence to support their use as a replacement for conventional menopause therapies. These products include evening primrose, ginseng, and red clover. WHAT ARE THE CASES WHEN HERBAL PRODUCTS SHOULD NOT BE USED DURING MENOPAUSE? Do not use herbal products during menopause without your health care provider's approval if:  You are taking medicine.  You have a preexisting liver condition. ARE THERE ANY RISKS IN MY TAKING HERBAL PRODUCTS DURING MENOPAUSE? If you choose to use herbal products to help with symptoms of menopause, keep in mind that:  Different supplements have different and unmeasured amounts of herbal ingredients.  Herbal products are not regulated the same way that medicines are.  Concentrations of herbs may vary depending on the way they are prepared. For example, the concentration may be different in a pill, tea, oil, and tincture.  Little is known about the risks of using herbal products, particularly the risks of long-term use.  Some herbal supplements can be harmful when combined with certain medicines. Most commonly reported side effects of herbal products are mild. However, if used improperly, many herbal supplements can cause serious problems. Talk to your health care provider before starting any herbal product. If problems develop, stop taking the supplement and let your health care provider know.   This information is not  intended to replace advice given to you by your health care provider. Make sure you discuss any questions you have with your health care provider.   Document Released: 04/01/2008 Document Revised: 11/04/2014 Document Reviewed: 03/29/2014 Elsevier Interactive Patient Education [employer].
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
## 2015-08-28 ENCOUNTER — Other Ambulatory Visit: Payer: Self-pay

## 2015-08-28 DIAGNOSIS — Z1231 Encounter for screening mammogram for malignant neoplasm of breast: Secondary | ICD-10-CM

## 2015-08-31 ENCOUNTER — Ambulatory Visit (INDEPENDENT_AMBULATORY_CARE_PROVIDER_SITE_OTHER): Payer: BLUE CROSS/BLUE SHIELD | Admitting: Podiatry

## 2015-08-31 ENCOUNTER — Encounter: Payer: Self-pay | Admitting: Podiatry

## 2015-08-31 VITALS — BP 129/74 | HR 68 | Resp 12

## 2015-08-31 DIAGNOSIS — M898X9 Other specified disorders of bone, unspecified site: Secondary | ICD-10-CM | POA: Diagnosis not present

## 2015-08-31 DIAGNOSIS — M2040 Other hammer toe(s) (acquired), unspecified foot: Secondary | ICD-10-CM

## 2015-08-31 NOTE — Progress Notes (Signed)
She presents today with her sister for surgical consult regarding her lateral feet. She states them ready to get rid of these painful toes and see what we can do about this lateral foot. She states that the abrasion that she had/superficial ulceration/plantar aspect lateral right foot has resolved.  Objective: Vital signs are stable alert and oriented 3. Pulses are available. Neurologic sensorium is intact. She has severe hammertoe deformity with overlapping fifth digit left foot fourth digit right foot and a very prominent fifth metatarsal base right foot.  Assessment: Hammertoe deformity #4 right exostosis fifth met base right and hammertoe deformity #5 left.  Plan: We discussed etiology pathology conservative versus surgical therapies. At this point we discussed amputation of the fourth and fifth digits as well as a fifth metatarsal base recontouring or partial ostectomy. We consented her for these procedures and we went over the consent form today line by line number by number giving her a couple time as question she saw. Regarding those procedures. I answered them to the best of my ability in layman's terms she understood it was amenable to it and signed all 3 pages of the consent form. We discussed the possible complications which may include but are not limited to postop pain bleeding swelling infection recurrence and need for further surgery loss of digit loss of limb loss of life. Follow up with her in the near future.

## 2015-08-31 NOTE — Patient Instructions (Signed)
Pre-Operative Instructions  Congratulations, you have decided to take an important step to improving your quality of life.  You can be assured that the doctors of [facility] will be with you every step of the way.  1. Plan to be at the surgery center/hospital at least 1 (one) hour prior to your scheduled time unless otherwise directed by the surgical center/hospital staff.  You must have a responsible adult accompany you, remain during the surgery and drive you home.  Make sure you have directions to the surgical center/hospital and know how to get there on time. 2. For hospital based surgery you will need to obtain a history and physical form from your family physician within 1 month prior to the date of surgery- we will give you a form for you primary physician.  3. We make every effort to accommodate the date you request for surgery.  There are however, times where surgery dates or times have to be moved.  We will contact you as soon as possible if a change in schedule is required.   4. No Aspirin/Ibuprofen for one week before surgery.  If you are on aspirin, any non-steroidal anti-inflammatory medications (Mobic, Aleve, Ibuprofen) you should stop taking it 7 days prior to your surgery.  You make take Tylenol  For pain prior to surgery.  5. Medications- If you are taking daily heart and blood pressure medications, seizure, reflux, allergy, asthma, anxiety, pain or diabetes medications, make sure the surgery center/hospital is aware before the day of surgery so they may notify you which medications to take or avoid the day of surgery. 6. No food or drink after midnight the night before surgery unless directed otherwise by surgical center/hospital staff. 7. No alcoholic beverages 24 hours prior to surgery.  No smoking 24 hours prior to or 24 hours after surgery. 8. Wear loose pants or shorts- loose enough to fit over bandages, boots, and casts. 9. No slip on shoes, sneakers are best. 10. Bring  your boot with you to the surgery center/hospital.  Also bring crutches or a walker if your physician has prescribed it for you.  If you do not have this equipment, it will be provided for you after surgery. 11. If you have not been contracted by the surgery center/hospital by the day before your surgery, call to confirm the date and time of your surgery. 12. Leave-time from work may vary depending on the type of surgery you have.  Appropriate arrangements should be made prior to surgery with your employer. 13. Prescriptions will be provided immediately following surgery by your doctor.  Have these filled as soon as possible after surgery and take the medication as directed. 14. Remove nail polish on the operative foot. 15. Wash the night before surgery.  The night before surgery wash the foot and leg well with the antibacterial soap provided and water paying special attention to beneath the toenails and in between the toes.  Rinse thoroughly with water and dry well with a towel.  Perform this wash unless told not to do so by your physician.  Enclosed: 1 Ice pack (please put in freezer the night before surgery)   1 Hibiclens skin cleaner   Pre-op Instructions  If you have any questions regarding the instructions, do not hesitate to call our office.  [place]: [address] [place], [place] [address] [phone]  [place]: [address] [place], [place] [address] [phone]  [place]: [address]  [place], [place] [address] [phone]  Dr. Richard   Tuchman DPM, Dr. Norman Regal DPM Dr. Richard Sikora DPM, Dr. M. Todd Sanjit Mcmichael DPM, Dr. Kathryn Egerton DPM 

## 2015-09-07 ENCOUNTER — Other Ambulatory Visit: Payer: Self-pay | Admitting: Podiatry

## 2015-09-07 MED ORDER — OXYCODONE-ACETAMINOPHEN 10-325 MG PO TABS: 1.0000 | ORAL_TABLET | Freq: Four times a day (QID) | ORAL | Status: DC | PRN

## 2015-09-07 MED ORDER — PROMETHAZINE HCL 25 MG PO TABS: 25.0000 mg | ORAL_TABLET | Freq: Three times a day (TID) | ORAL | Status: DC | PRN

## 2015-09-07 MED ORDER — CLINDAMYCIN HCL 150 MG PO CAPS: 150.0000 mg | ORAL_CAPSULE | Freq: Three times a day (TID) | ORAL | Status: DC

## 2015-09-08 DIAGNOSIS — M86679 Other chronic osteomyelitis, unspecified ankle and foot: Secondary | ICD-10-CM | POA: Diagnosis not present

## 2015-09-08 DIAGNOSIS — M21549 Acquired clubfoot, unspecified foot: Secondary | ICD-10-CM | POA: Diagnosis not present

## 2015-09-15 ENCOUNTER — Ambulatory Visit (INDEPENDENT_AMBULATORY_CARE_PROVIDER_SITE_OTHER): Payer: BLUE CROSS/BLUE SHIELD | Admitting: Podiatry

## 2015-09-15 ENCOUNTER — Ambulatory Visit (INDEPENDENT_AMBULATORY_CARE_PROVIDER_SITE_OTHER): Payer: BLUE CROSS/BLUE SHIELD

## 2015-09-15 VITALS — BP 128/87 | HR 64 | Resp 16

## 2015-09-15 DIAGNOSIS — M2040 Other hammer toe(s) (acquired), unspecified foot: Secondary | ICD-10-CM

## 2015-09-15 DIAGNOSIS — M898X9 Other specified disorders of bone, unspecified site: Secondary | ICD-10-CM

## 2015-09-15 DIAGNOSIS — Z9889 Other specified postprocedural states: Secondary | ICD-10-CM

## 2015-09-16 NOTE — Progress Notes (Signed)
Patient ID: Rebecca [name], female   DOB: 1951-07-29, 64 y.o.   MRN: [id number]  Subjective: Rebecca [name] is a 64 y.o. 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 Milinda Pointer. They state their pain is improving and currently controlled. She gets some intermittent discomfort. Denies any systemic complaints such as fevers, chills, nausea, vomiting. No calf pain, chest pain, shortness of breath.   Objective: General: No acute distress, AAOx3  DP/PT pulses palpable 2/4, CRT < 3 sec to all digits.  Protective sensation intact. Motor function intact.  Right and left foot: Incisions are all well coapted without any evidence of dehiscence and staples are intact. There is no surrounding erythema, ascending cellulitis, fluctuance, crepitus, malodor, drainage/purulence. There is mild edema around the surgical site on the right > left. There is no signficant pain along the surgical site.  No other areas of tenderness to bilateral lower extremities.  No other open lesions or pre-ulcerative lesions.  No pain with calf compression, swelling, warmth, erythema.   Assessment and Plan:  Status post bilateral foot surgery, doing well with no complications   -Treatment options discussed including all alternatives, risks, and complications -Antibiotic ointment was placed over the incision followed by dry sterile dressing. Keep dressing clean, dry, intact. -Continue with CAM boot/surgical shoe at all times. -Ice/elevation -Pain medication as needed. -Monitor for any clinical signs or symptoms of infection and DVT/PE and directed to call the office immediately should any occur or go to the ER. -Follow-up in 1 week for likely suture remvaol or sooner if any problems arise. In the meantime, encouraged to call the office with any questions, concerns, change in symptoms.   Celesta Gentile, DPM

## 2015-09-19 ENCOUNTER — Encounter: Payer: Self-pay | Admitting: Podiatry

## 2015-09-19 ENCOUNTER — Ambulatory Visit (INDEPENDENT_AMBULATORY_CARE_PROVIDER_SITE_OTHER): Payer: BLUE CROSS/BLUE SHIELD | Admitting: Podiatry

## 2015-09-19 VITALS — BP 133/69 | HR 72 | Resp 12

## 2015-09-19 DIAGNOSIS — M2040 Other hammer toe(s) (acquired), unspecified foot: Secondary | ICD-10-CM

## 2015-09-19 DIAGNOSIS — M898X9 Other specified disorders of bone, unspecified site: Secondary | ICD-10-CM

## 2015-09-19 DIAGNOSIS — Z9889 Other specified postprocedural states: Secondary | ICD-10-CM

## 2015-09-25 NOTE — Progress Notes (Signed)
Patient ID: Rebecca [name], female   DOB: Feb 13, 1951, 64 y.o.   MRN: [id number]   Subjective: Rebecca [name] is a 64 y.o. 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 Milinda Pointer. They state their pain is contorlled. She gets some intermittent discomfort. Denies any systemic complaints such as fevers, chills, nausea, vomiting. No calf pain, chest pain, shortness of breath.   Objective: General: No acute distress, AAOx3  DP/PT pulses palpable 2/4, CRT < 3 sec to all digits.  Protective sensation intact. Motor function intact.  Right and left foot: Incisions are all well coapted without any evidence of dehiscence and staples are intact. There is no surrounding erythema, ascending cellulitis, fluctuance, crepitus, malodor, drainage/purulence. There is mild edema around the surgical site on the right > left. There is no signficant pain along the surgical site. There does appear to be some slight movement across the incision site to maintain sutures do not appear to be ready to be removed at this time. No other areas of tenderness to bilateral lower extremities.  No other open lesions or pre-ulcerative lesions.  No pain with calf compression, swelling, warmth, erythema.   Assessment and Plan:  Status post bilateral foot surgery, doing well with no complications   -Treatment options discussed including all alternatives, risks, and complications -Antibiotic ointment was placed over the incision followed by dry sterile dressing. Keep dressing clean, dry, intact. -Continue with CAM boot/surgical shoe at all times. -Ice/elevation -Pain medication as needed. -Monitor for any clinical signs or symptoms of infection and DVT/PE and directed to call the office immediately should any occur or go to the ER. -Follow-up in 1 week for likely suture removal with Dr. Milinda Pointer or sooner if any problems arise. In the meantime, encouraged to  call the office with any questions, concerns, change in symptoms.   Celesta Gentile, DPM

## 2015-09-26 ENCOUNTER — Ambulatory Visit (INDEPENDENT_AMBULATORY_CARE_PROVIDER_SITE_OTHER): Payer: BLUE CROSS/BLUE SHIELD | Admitting: Podiatry

## 2015-09-26 ENCOUNTER — Encounter: Payer: Self-pay | Admitting: Podiatry

## 2015-09-26 VITALS — BP 138/79 | HR 69 | Resp 16

## 2015-09-26 DIAGNOSIS — M2040 Other hammer toe(s) (acquired), unspecified foot: Secondary | ICD-10-CM | POA: Diagnosis not present

## 2015-09-26 DIAGNOSIS — Z9889 Other specified postprocedural states: Secondary | ICD-10-CM

## 2015-09-26 NOTE — Progress Notes (Signed)
She presents today status post fifth metatarsal exostectomy base and amputation fifth digit left and fourth digit right. She denies fever chills nausea vomiting muscle aches and pains presents with Cam Walker on the right foot and a Darco shoe to the left foot with dressings.  Objective: Vital signs are stable she is alert and oriented 3. Pulses are palpable bilateral. Minimal edema no erythema cellulitis drainage or odor. Sutures are intact to all surgical sites.  Assessment: Well-healing surgical foot status post amputation fifth digit left foot fourth digit right foot and a fifth metatarsal base exostectomy.  Plan: Sutures were removed today. I'm going to allow her to get back into a regular shoe on her left foot but because of the plantar lateral incision on the right foot we will continue the use of the Cam Walker for at least 2 more weeks. I will follow-up with her in 2 weeks. I will allow her to shower and so Insults and warm water after showering. We also dispensed a compression anklet.  Roselind Messier DPM

## 2015-10-10 ENCOUNTER — Ambulatory Visit (INDEPENDENT_AMBULATORY_CARE_PROVIDER_SITE_OTHER): Payer: BLUE CROSS/BLUE SHIELD | Admitting: Podiatry

## 2015-10-10 ENCOUNTER — Encounter: Payer: Self-pay | Admitting: Podiatry

## 2015-10-10 ENCOUNTER — Ambulatory Visit
Admission: RE | Admit: 2015-10-10 | Discharge: 2015-10-10 | Disposition: A | Discharge status: Home / Self Care | Payer: BLUE CROSS/BLUE SHIELD | Source: Ambulatory Visit

## 2015-10-10 VITALS — BP 135/83 | HR 65 | Resp 16

## 2015-10-10 DIAGNOSIS — M898X9 Other specified disorders of bone, unspecified site: Secondary | ICD-10-CM | POA: Diagnosis not present

## 2015-10-10 DIAGNOSIS — Z1231 Encounter for screening mammogram for malignant neoplasm of breast: Secondary | ICD-10-CM

## 2015-10-10 DIAGNOSIS — M2042 Other hammer toe(s) (acquired), left foot: Secondary | ICD-10-CM

## 2015-10-10 DIAGNOSIS — Z9889 Other specified postprocedural states: Secondary | ICD-10-CM

## 2015-10-10 NOTE — Progress Notes (Signed)
Rebecca [name] presents today follow-up of bilateral foot surgery 8 of surgery 09/08/2015. Surgery consisted of amputation fifth digit left foot amputation of fourth digit right foot and a partial exostectomy fifth metatarsal base right foot. Rebecca [name] states that everything seems to be doing so much better and Rebecca [name] is very happy with the outcome thus far.   Objective: vital signs are stable alert and oriented 3. Pulses are strongly palpable. Neurologic sensorium is intact per Semmes-Weinstein monofilament. Sutures have all been removed margins remain well coapted there is no signs of infection.  Assessment: While in surgical foot right.   Plan I would request that Rebecca [name] is transfer from a Cam Walker to a Darco shoe. I will follow-up with her in 2-3 weeks at which time we will take x-rays of the right foot and considered normal shoe gear.

## 2015-10-18 NOTE — Progress Notes (Signed)
Patient ID: Rebecca [name], female   DOB: 1951/09/07, 64 y.o.   MRN: [id number] Dr Milinda Pointer performed a amputation of 4th right toe, partial ostectomy base 5th metatarsal right foot, amputation 5th toe left foot on 09/08/2015 at [facility]

## 2015-11-07 ENCOUNTER — Ambulatory Visit (INDEPENDENT_AMBULATORY_CARE_PROVIDER_SITE_OTHER): Payer: Medicare Other | Admitting: Podiatry

## 2015-11-07 ENCOUNTER — Encounter: Payer: Self-pay | Admitting: Podiatry

## 2015-11-07 ENCOUNTER — Encounter: Payer: BLUE CROSS/BLUE SHIELD | Admitting: Podiatry

## 2015-11-07 VITALS — BP 120/67 | HR 72 | Resp 12

## 2015-11-07 DIAGNOSIS — M2042 Other hammer toe(s) (acquired), left foot: Secondary | ICD-10-CM

## 2015-11-07 DIAGNOSIS — M898X9 Other specified disorders of bone, unspecified site: Secondary | ICD-10-CM

## 2015-11-07 DIAGNOSIS — Z9889 Other specified postprocedural states: Secondary | ICD-10-CM

## 2015-11-07 NOTE — Progress Notes (Signed)
She presents today for her final postop visit regarding" fifth digit left fourth digit right and partial ostectomy fifth metatarsal base right foot. She states that she is doing very well and has little problems. States that the lateral aspect of the foot is occasionally sore.  Objective: Vital signs are stable she is alert and oriented 3. Pulses are strongly palpable. Surgical sites have gone onto heal uneventfully she does have a small palpable nodule to the proximal incision site of the fifth metatarsal ostectomy right foot. This is more than likely simply a suture knot.  Assessment: Well-healing surgical foot bilateral.  Plan: Follow up with me as needed.

## 2015-11-21 ENCOUNTER — Ambulatory Visit (INDEPENDENT_AMBULATORY_CARE_PROVIDER_SITE_OTHER): Payer: Medicare Other

## 2015-11-21 ENCOUNTER — Encounter: Payer: Self-pay | Admitting: Podiatry

## 2015-11-21 ENCOUNTER — Ambulatory Visit (INDEPENDENT_AMBULATORY_CARE_PROVIDER_SITE_OTHER): Payer: Medicare Other | Admitting: Podiatry

## 2015-11-21 VITALS — BP 106/85 | HR 72 | Resp 16

## 2015-11-21 DIAGNOSIS — M779 Enthesopathy, unspecified: Secondary | ICD-10-CM

## 2015-11-21 NOTE — Progress Notes (Signed)
She presents today stating that she must have injured her ankle at some point last week. She is status post fifth metatarsal base exostectomy as well as fourth and fifth digital amputations bilateral. Surgical site she is doing very well with and states that she had been active trying to strengthen her ankles and maybe injured her right ankle. She denies rolling it.  Objective: Vital signs are stable alert and oriented 3. Pulses are palpable. She has pain on palpation of the tibialis posterior tendon right foot. Mild edema. Inversion against resistance is nontender. Radiographs do not demonstrate any type of osseus abnormalities.  Assessment: Posterior tibial tendinitis right.  Plan: At this point I injected small amount of dexamethasone and local anesthetic since she agreed to go back into her Cam Walker for the next 3 weeks. I will follow up with her at that time.

## 2015-12-12 ENCOUNTER — Encounter: Payer: Self-pay | Admitting: Podiatry

## 2015-12-12 ENCOUNTER — Ambulatory Visit (INDEPENDENT_AMBULATORY_CARE_PROVIDER_SITE_OTHER): Payer: Medicare Other | Admitting: Podiatry

## 2015-12-12 VITALS — BP 123/66 | HR 64 | Resp 16

## 2015-12-12 DIAGNOSIS — M6789 Other specified disorders of synovium and tendon, multiple sites: Secondary | ICD-10-CM | POA: Diagnosis not present

## 2015-12-12 DIAGNOSIS — M76829 Posterior tibial tendinitis, unspecified leg: Secondary | ICD-10-CM

## 2015-12-12 NOTE — Progress Notes (Signed)
She presents today with her sister stating that her posterior tibial tendon dysfunction and tendinitis is doing much better. She states that she is still keeping it wrapped.  Objective: Vital signs are stable she is alert and oriented 3. Pulses are palpable. And neurologic sensorium is intact. Deep tendon reflexes are intact bilateral and muscle strength +5 over 5 is normal. She has much decrease in edema and tenderness on palpation of the posterior tibial tendon of the right foot today.  Assessment: Well-healing tendinitis posterior tibial tendon right foot.  Plan: Anchors to continue to wrap the foot on a regular basis and will follow-up with me as needed.

## 2016-01-09 ENCOUNTER — Ambulatory Visit: Payer: Medicare Other | Admitting: Podiatry

## 2016-01-16 DIAGNOSIS — S83281D Other tear of lateral meniscus, current injury, right knee, subsequent encounter: Secondary | ICD-10-CM | POA: Diagnosis not present

## 2016-01-16 DIAGNOSIS — S83241D Other tear of medial meniscus, current injury, right knee, subsequent encounter: Secondary | ICD-10-CM | POA: Diagnosis not present

## 2016-01-16 DIAGNOSIS — M25562 Pain in left knee: Secondary | ICD-10-CM | POA: Diagnosis not present

## 2016-01-18 ENCOUNTER — Other Ambulatory Visit: Payer: Self-pay | Admitting: Family Medicine

## 2016-01-18 DIAGNOSIS — M8580 Other specified disorders of bone density and structure, unspecified site: Secondary | ICD-10-CM

## 2016-01-23 DIAGNOSIS — H401133 Primary open-angle glaucoma, bilateral, severe stage: Secondary | ICD-10-CM | POA: Diagnosis not present

## 2016-02-06 ENCOUNTER — Other Ambulatory Visit: Payer: BLUE CROSS/BLUE SHIELD

## 2016-02-15 DIAGNOSIS — R636 Underweight: Secondary | ICD-10-CM | POA: Diagnosis not present

## 2016-02-15 DIAGNOSIS — Z136 Encounter for screening for cardiovascular disorders: Secondary | ICD-10-CM | POA: Diagnosis not present

## 2016-02-15 DIAGNOSIS — Z9181 History of falling: Secondary | ICD-10-CM | POA: Diagnosis not present

## 2016-02-15 DIAGNOSIS — M8580 Other specified disorders of bone density and structure, unspecified site: Secondary | ICD-10-CM | POA: Diagnosis not present

## 2016-02-15 DIAGNOSIS — E559 Vitamin D deficiency, unspecified: Secondary | ICD-10-CM | POA: Diagnosis not present

## 2016-02-15 DIAGNOSIS — Z79899 Other long term (current) drug therapy: Secondary | ICD-10-CM | POA: Diagnosis not present

## 2016-02-15 DIAGNOSIS — E0590 Thyrotoxicosis, unspecified without thyrotoxic crisis or storm: Secondary | ICD-10-CM | POA: Diagnosis not present

## 2016-02-15 DIAGNOSIS — Z13220 Encounter for screening for lipoid disorders: Secondary | ICD-10-CM | POA: Diagnosis not present

## 2016-02-15 DIAGNOSIS — Z681 Body mass index (BMI) 19 or less, adult: Secondary | ICD-10-CM | POA: Diagnosis not present

## 2016-02-23 DIAGNOSIS — H401133 Primary open-angle glaucoma, bilateral, severe stage: Secondary | ICD-10-CM | POA: Diagnosis not present

## 2016-03-08 DIAGNOSIS — D1801 Hemangioma of skin and subcutaneous tissue: Secondary | ICD-10-CM | POA: Diagnosis not present

## 2016-03-08 DIAGNOSIS — D225 Melanocytic nevi of trunk: Secondary | ICD-10-CM | POA: Diagnosis not present

## 2016-03-08 DIAGNOSIS — L720 Epidermal cyst: Secondary | ICD-10-CM | POA: Diagnosis not present

## 2016-03-26 ENCOUNTER — Ambulatory Visit
Admission: RE | Admit: 2016-03-26 | Discharge: 2016-03-26 | Disposition: A | Discharge status: Home / Self Care | Payer: Medicare Other | Source: Ambulatory Visit | Attending: Family Medicine | Admitting: Family Medicine

## 2016-03-26 DIAGNOSIS — M8589 Other specified disorders of bone density and structure, multiple sites: Secondary | ICD-10-CM | POA: Diagnosis not present

## 2016-03-26 DIAGNOSIS — M8580 Other specified disorders of bone density and structure, unspecified site: Secondary | ICD-10-CM

## 2016-03-26 DIAGNOSIS — Z780 Asymptomatic menopausal state: Secondary | ICD-10-CM | POA: Diagnosis not present

## 2016-06-19 DIAGNOSIS — H43813 Vitreous degeneration, bilateral: Secondary | ICD-10-CM | POA: Diagnosis not present

## 2016-06-19 DIAGNOSIS — H47233 Glaucomatous optic atrophy, bilateral: Secondary | ICD-10-CM | POA: Diagnosis not present

## 2016-06-19 DIAGNOSIS — H401133 Primary open-angle glaucoma, bilateral, severe stage: Secondary | ICD-10-CM | POA: Diagnosis not present

## 2016-06-19 DIAGNOSIS — H2511 Age-related nuclear cataract, right eye: Secondary | ICD-10-CM | POA: Diagnosis not present

## 2016-07-08 DIAGNOSIS — H9202 Otalgia, left ear: Secondary | ICD-10-CM | POA: Diagnosis not present

## 2016-07-08 DIAGNOSIS — Z681 Body mass index (BMI) 19 or less, adult: Secondary | ICD-10-CM | POA: Diagnosis not present

## 2016-07-08 DIAGNOSIS — H6122 Impacted cerumen, left ear: Secondary | ICD-10-CM | POA: Diagnosis not present

## 2016-07-25 DIAGNOSIS — M25562 Pain in left knee: Secondary | ICD-10-CM | POA: Diagnosis not present

## 2016-07-25 DIAGNOSIS — M25552 Pain in left hip: Secondary | ICD-10-CM | POA: Diagnosis not present

## 2016-08-01 DIAGNOSIS — M25562 Pain in left knee: Secondary | ICD-10-CM | POA: Diagnosis not present

## 2016-08-01 DIAGNOSIS — Z23 Encounter for immunization: Secondary | ICD-10-CM | POA: Diagnosis not present

## 2016-08-06 DIAGNOSIS — M25562 Pain in left knee: Secondary | ICD-10-CM | POA: Diagnosis not present

## 2016-08-06 DIAGNOSIS — Z96652 Presence of left artificial knee joint: Secondary | ICD-10-CM | POA: Diagnosis not present

## 2016-08-06 DIAGNOSIS — M25662 Stiffness of left knee, not elsewhere classified: Secondary | ICD-10-CM | POA: Diagnosis not present

## 2016-08-08 DIAGNOSIS — M25662 Stiffness of left knee, not elsewhere classified: Secondary | ICD-10-CM | POA: Diagnosis not present

## 2016-08-08 DIAGNOSIS — Z96652 Presence of left artificial knee joint: Secondary | ICD-10-CM | POA: Diagnosis not present

## 2016-08-08 DIAGNOSIS — M25562 Pain in left knee: Secondary | ICD-10-CM | POA: Diagnosis not present

## 2016-08-12 DIAGNOSIS — M25562 Pain in left knee: Secondary | ICD-10-CM | POA: Diagnosis not present

## 2016-08-12 DIAGNOSIS — Z96652 Presence of left artificial knee joint: Secondary | ICD-10-CM | POA: Diagnosis not present

## 2016-08-12 DIAGNOSIS — M25662 Stiffness of left knee, not elsewhere classified: Secondary | ICD-10-CM | POA: Diagnosis not present

## 2016-08-13 DIAGNOSIS — H409 Unspecified glaucoma: Secondary | ICD-10-CM | POA: Diagnosis not present

## 2016-08-13 DIAGNOSIS — Z681 Body mass index (BMI) 19 or less, adult: Secondary | ICD-10-CM | POA: Diagnosis not present

## 2016-08-13 DIAGNOSIS — E0590 Thyrotoxicosis, unspecified without thyrotoxic crisis or storm: Secondary | ICD-10-CM | POA: Diagnosis not present

## 2016-08-13 DIAGNOSIS — E7800 Pure hypercholesterolemia, unspecified: Secondary | ICD-10-CM | POA: Diagnosis not present

## 2016-08-13 DIAGNOSIS — E559 Vitamin D deficiency, unspecified: Secondary | ICD-10-CM | POA: Diagnosis not present

## 2016-08-13 DIAGNOSIS — M8580 Other specified disorders of bone density and structure, unspecified site: Secondary | ICD-10-CM | POA: Diagnosis not present

## 2016-08-15 DIAGNOSIS — Z96652 Presence of left artificial knee joint: Secondary | ICD-10-CM | POA: Diagnosis not present

## 2016-08-15 DIAGNOSIS — M25562 Pain in left knee: Secondary | ICD-10-CM | POA: Diagnosis not present

## 2016-08-15 DIAGNOSIS — M25662 Stiffness of left knee, not elsewhere classified: Secondary | ICD-10-CM | POA: Diagnosis not present

## 2016-08-20 DIAGNOSIS — H47233 Glaucomatous optic atrophy, bilateral: Secondary | ICD-10-CM | POA: Diagnosis not present

## 2016-08-20 DIAGNOSIS — H2511 Age-related nuclear cataract, right eye: Secondary | ICD-10-CM | POA: Diagnosis not present

## 2016-08-20 DIAGNOSIS — H43813 Vitreous degeneration, bilateral: Secondary | ICD-10-CM | POA: Diagnosis not present

## 2016-08-20 DIAGNOSIS — H401133 Primary open-angle glaucoma, bilateral, severe stage: Secondary | ICD-10-CM | POA: Diagnosis not present

## 2016-08-22 DIAGNOSIS — M25562 Pain in left knee: Secondary | ICD-10-CM | POA: Diagnosis not present

## 2016-08-22 DIAGNOSIS — M25662 Stiffness of left knee, not elsewhere classified: Secondary | ICD-10-CM | POA: Diagnosis not present

## 2016-08-22 DIAGNOSIS — Z96652 Presence of left artificial knee joint: Secondary | ICD-10-CM | POA: Diagnosis not present

## 2016-08-26 DIAGNOSIS — M25662 Stiffness of left knee, not elsewhere classified: Secondary | ICD-10-CM | POA: Diagnosis not present

## 2016-08-26 DIAGNOSIS — M25562 Pain in left knee: Secondary | ICD-10-CM | POA: Diagnosis not present

## 2016-08-26 DIAGNOSIS — Z96652 Presence of left artificial knee joint: Secondary | ICD-10-CM | POA: Diagnosis not present

## 2016-08-27 DIAGNOSIS — Z23 Encounter for immunization: Secondary | ICD-10-CM | POA: Diagnosis not present

## 2016-08-30 DIAGNOSIS — M8580 Other specified disorders of bone density and structure, unspecified site: Secondary | ICD-10-CM | POA: Diagnosis not present

## 2016-08-30 DIAGNOSIS — E0590 Thyrotoxicosis, unspecified without thyrotoxic crisis or storm: Secondary | ICD-10-CM | POA: Diagnosis not present

## 2016-08-30 DIAGNOSIS — Z1389 Encounter for screening for other disorder: Secondary | ICD-10-CM | POA: Diagnosis not present

## 2016-08-30 DIAGNOSIS — Z681 Body mass index (BMI) 19 or less, adult: Secondary | ICD-10-CM | POA: Diagnosis not present

## 2016-08-30 DIAGNOSIS — F419 Anxiety disorder, unspecified: Secondary | ICD-10-CM | POA: Diagnosis not present

## 2016-09-03 ENCOUNTER — Other Ambulatory Visit: Payer: Self-pay | Admitting: Obstetrics and Gynecology

## 2016-09-03 DIAGNOSIS — Z1231 Encounter for screening mammogram for malignant neoplasm of breast: Secondary | ICD-10-CM

## 2016-09-11 ENCOUNTER — Ambulatory Visit: Payer: BLUE CROSS/BLUE SHIELD | Admitting: Obstetrics and Gynecology

## 2016-10-14 DIAGNOSIS — H2513 Age-related nuclear cataract, bilateral: Secondary | ICD-10-CM | POA: Diagnosis not present

## 2016-10-14 DIAGNOSIS — H2511 Age-related nuclear cataract, right eye: Secondary | ICD-10-CM | POA: Diagnosis not present

## 2016-10-14 DIAGNOSIS — H25811 Combined forms of age-related cataract, right eye: Secondary | ICD-10-CM | POA: Diagnosis not present

## 2016-10-16 ENCOUNTER — Ambulatory Visit: Payer: Medicare Other

## 2016-10-17 ENCOUNTER — Ambulatory Visit: Payer: Medicare Other

## 2016-10-30 ENCOUNTER — Ambulatory Visit: Payer: Medicare Other | Admitting: Obstetrics and Gynecology

## 2016-12-04 ENCOUNTER — Ambulatory Visit: Payer: Medicare Other

## 2016-12-16 ENCOUNTER — Ambulatory Visit: Payer: Medicare Other | Admitting: Obstetrics and Gynecology

## 2016-12-27 ENCOUNTER — Ambulatory Visit: Payer: Medicare Other | Admitting: Obstetrics and Gynecology

## 2017-01-08 ENCOUNTER — Ambulatory Visit
Admission: RE | Admit: 2017-01-08 | Discharge: 2017-01-08 | Disposition: A | Discharge status: Home / Self Care | Payer: Medicare HMO | Source: Ambulatory Visit | Attending: Obstetrics and Gynecology | Admitting: Obstetrics and Gynecology

## 2017-01-08 DIAGNOSIS — H5231 Anisometropia: Secondary | ICD-10-CM | POA: Diagnosis not present

## 2017-01-08 DIAGNOSIS — Z1231 Encounter for screening mammogram for malignant neoplasm of breast: Secondary | ICD-10-CM | POA: Diagnosis not present

## 2017-01-08 DIAGNOSIS — H401133 Primary open-angle glaucoma, bilateral, severe stage: Secondary | ICD-10-CM | POA: Diagnosis not present

## 2017-01-20 ENCOUNTER — Ambulatory Visit: Payer: Medicare Other | Admitting: Obstetrics and Gynecology

## 2017-01-20 DIAGNOSIS — G4700 Insomnia, unspecified: Secondary | ICD-10-CM | POA: Diagnosis not present

## 2017-01-20 DIAGNOSIS — Z681 Body mass index (BMI) 19 or less, adult: Secondary | ICD-10-CM | POA: Diagnosis not present
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
## 2017-02-07 ENCOUNTER — Encounter: Payer: Self-pay | Admitting: Obstetrics and Gynecology

## 2017-02-07 ENCOUNTER — Ambulatory Visit (INDEPENDENT_AMBULATORY_CARE_PROVIDER_SITE_OTHER): Payer: Medicare HMO | Admitting: Obstetrics and Gynecology

## 2017-02-07 VITALS — BP 110/62 | HR 88 | Resp 18 | Ht 64.5 in | Wt 117.4 lb

## 2017-02-07 DIAGNOSIS — Z01419 Encounter for gynecological examination (general) (routine) without abnormal findings: Secondary | ICD-10-CM

## 2017-02-07 NOTE — Patient Instructions (Addendum)

## 2017-02-10 LAB — IPS PAP SMEAR ONLY

## 2017-02-11 DIAGNOSIS — E0590 Thyrotoxicosis, unspecified without thyrotoxic crisis or storm: Secondary | ICD-10-CM | POA: Diagnosis not present

## 2017-02-11 DIAGNOSIS — E559 Vitamin D deficiency, unspecified: Secondary | ICD-10-CM | POA: Diagnosis not present

## 2017-02-11 DIAGNOSIS — E7800 Pure hypercholesterolemia, unspecified: Secondary | ICD-10-CM | POA: Diagnosis not present

## 2017-02-13 DIAGNOSIS — E559 Vitamin D deficiency, unspecified: Secondary | ICD-10-CM | POA: Diagnosis not present

## 2017-02-13 DIAGNOSIS — M8580 Other specified disorders of bone density and structure, unspecified site: Secondary | ICD-10-CM | POA: Diagnosis not present

## 2017-02-13 DIAGNOSIS — E7800 Pure hypercholesterolemia, unspecified: Secondary | ICD-10-CM | POA: Diagnosis not present

## 2017-02-13 DIAGNOSIS — E0590 Thyrotoxicosis, unspecified without thyrotoxic crisis or storm: Secondary | ICD-10-CM | POA: Diagnosis not present

## 2017-03-04 DIAGNOSIS — H25011 Cortical age-related cataract, right eye: Secondary | ICD-10-CM | POA: Diagnosis not present

## 2017-03-04 DIAGNOSIS — R69 Illness, unspecified: Secondary | ICD-10-CM | POA: Diagnosis not present

## 2017-03-05 DIAGNOSIS — R69 Illness, unspecified: Secondary | ICD-10-CM | POA: Diagnosis not present

## 2017-03-11 DIAGNOSIS — M1711 Unilateral primary osteoarthritis, right knee: Secondary | ICD-10-CM | POA: Diagnosis not present

## 2017-04-15 DIAGNOSIS — H401133 Primary open-angle glaucoma, bilateral, severe stage: Secondary | ICD-10-CM | POA: Diagnosis not present

## 2017-04-15 DIAGNOSIS — H1013 Acute atopic conjunctivitis, bilateral: Secondary | ICD-10-CM | POA: Diagnosis not present

## 2017-04-15 DIAGNOSIS — H43813 Vitreous degeneration, bilateral: Secondary | ICD-10-CM | POA: Diagnosis not present

## 2017-07-03 DIAGNOSIS — M1711 Unilateral primary osteoarthritis, right knee: Secondary | ICD-10-CM | POA: Diagnosis not present

## 2017-07-15 DIAGNOSIS — R3129 Other microscopic hematuria: Secondary | ICD-10-CM | POA: Diagnosis not present

## 2017-07-15 DIAGNOSIS — Z681 Body mass index (BMI) 19 or less, adult: Secondary | ICD-10-CM | POA: Diagnosis not present

## 2017-07-16 DIAGNOSIS — R3129 Other microscopic hematuria: Secondary | ICD-10-CM | POA: Diagnosis not present

## 2017-07-16 DIAGNOSIS — M1711 Unilateral primary osteoarthritis, right knee: Secondary | ICD-10-CM | POA: Diagnosis not present

## 2017-07-23 DIAGNOSIS — M1711 Unilateral primary osteoarthritis, right knee: Secondary | ICD-10-CM | POA: Diagnosis not present

## 2017-07-30 DIAGNOSIS — I10 Essential (primary) hypertension: Secondary | ICD-10-CM | POA: Diagnosis not present

## 2017-07-30 DIAGNOSIS — Z9981 Dependence on supplemental oxygen: Secondary | ICD-10-CM | POA: Diagnosis not present

## 2017-07-30 DIAGNOSIS — M2550 Pain in unspecified joint: Secondary | ICD-10-CM | POA: Diagnosis not present

## 2017-07-30 DIAGNOSIS — M1711 Unilateral primary osteoarthritis, right knee: Secondary | ICD-10-CM | POA: Diagnosis not present

## 2017-07-30 DIAGNOSIS — R2689 Other abnormalities of gait and mobility: Secondary | ICD-10-CM | POA: Diagnosis not present

## 2017-07-31 DIAGNOSIS — R69 Illness, unspecified: Secondary | ICD-10-CM | POA: Diagnosis not present

## 2017-08-18 DIAGNOSIS — R3129 Other microscopic hematuria: Secondary | ICD-10-CM | POA: Diagnosis not present

## 2017-08-18 DIAGNOSIS — E7800 Pure hypercholesterolemia, unspecified: Secondary | ICD-10-CM | POA: Diagnosis not present

## 2017-08-18 DIAGNOSIS — Z681 Body mass index (BMI) 19 or less, adult: Secondary | ICD-10-CM | POA: Diagnosis not present

## 2017-08-18 DIAGNOSIS — H9193 Unspecified hearing loss, bilateral: Secondary | ICD-10-CM | POA: Diagnosis not present

## 2017-08-19 DIAGNOSIS — Z9181 History of falling: Secondary | ICD-10-CM | POA: Diagnosis not present

## 2017-08-19 DIAGNOSIS — Z136 Encounter for screening for cardiovascular disorders: Secondary | ICD-10-CM | POA: Diagnosis not present

## 2017-08-19 DIAGNOSIS — E785 Hyperlipidemia, unspecified: Secondary | ICD-10-CM | POA: Diagnosis not present

## 2017-08-19 DIAGNOSIS — Z1389 Encounter for screening for other disorder: Secondary | ICD-10-CM | POA: Diagnosis not present

## 2017-08-19 DIAGNOSIS — Z0000 Encounter for general adult medical examination without abnormal findings: Secondary | ICD-10-CM | POA: Diagnosis not present

## 2017-09-02 DIAGNOSIS — R69 Illness, unspecified: Secondary | ICD-10-CM | POA: Diagnosis not present

## 2017-09-03 DIAGNOSIS — R3129 Other microscopic hematuria: Secondary | ICD-10-CM | POA: Diagnosis not present

## 2017-09-03 DIAGNOSIS — E7800 Pure hypercholesterolemia, unspecified: Secondary | ICD-10-CM | POA: Diagnosis not present

## 2017-10-16 DIAGNOSIS — H401133 Primary open-angle glaucoma, bilateral, severe stage: Secondary | ICD-10-CM | POA: Diagnosis not present

## 2017-11-05 DIAGNOSIS — H6123 Impacted cerumen, bilateral: Secondary | ICD-10-CM | POA: Diagnosis not present

## 2017-11-05 DIAGNOSIS — H9123 Sudden idiopathic hearing loss, bilateral: Secondary | ICD-10-CM | POA: Diagnosis not present

## 2017-12-02 ENCOUNTER — Other Ambulatory Visit: Payer: Self-pay | Admitting: Family Medicine

## 2017-12-02 DIAGNOSIS — Z1231 Encounter for screening mammogram for malignant neoplasm of breast: Secondary | ICD-10-CM

## 2018-01-13 ENCOUNTER — Ambulatory Visit: Payer: Medicare HMO

## 2018-01-15 ENCOUNTER — Ambulatory Visit: Payer: Medicare HMO | Admitting: Podiatry

## 2018-02-03 ENCOUNTER — Encounter: Payer: Self-pay | Admitting: Podiatry

## 2018-02-03 ENCOUNTER — Ambulatory Visit
Admission: RE | Admit: 2018-02-03 | Discharge: 2018-02-03 | Disposition: A | Discharge status: Home / Self Care | Payer: Medicare HMO | Source: Ambulatory Visit | Attending: Family Medicine | Admitting: Family Medicine

## 2018-02-03 ENCOUNTER — Ambulatory Visit: Payer: Medicare HMO | Admitting: Podiatry

## 2018-02-03 DIAGNOSIS — Q828 Other specified congenital malformations of skin: Secondary | ICD-10-CM

## 2018-02-03 DIAGNOSIS — Z1231 Encounter for screening mammogram for malignant neoplasm of breast: Secondary | ICD-10-CM

## 2018-02-04 ENCOUNTER — Other Ambulatory Visit: Payer: Self-pay | Admitting: Family Medicine

## 2018-02-04 DIAGNOSIS — M85861 Other specified disorders of bone density and structure, right lower leg: Secondary | ICD-10-CM

## 2018-02-04 NOTE — Progress Notes (Signed)
She presents today chief complaint of painful calluses plantar aspect of the bilateral foot.  Objective: Pulses remain palpable multiple small porokeratotic lesions plantar aspect of the forefoot with forefoot deformities and history of amputation of her toes.  Assessment: Porokeratosis.  Plan: Debridement of all reactive hyperkeratotic tissue for her.  Follow-up with [place] as needed.

## 2018-02-11 ENCOUNTER — Ambulatory Visit: Payer: Medicare HMO | Admitting: Obstetrics and Gynecology

## 2018-02-16 ENCOUNTER — Telehealth: Payer: Self-pay | Admitting: Obstetrics and Gynecology

## 2018-02-16 DIAGNOSIS — E0590 Thyrotoxicosis, unspecified without thyrotoxic crisis or storm: Secondary | ICD-10-CM | POA: Diagnosis not present

## 2018-02-16 DIAGNOSIS — Z681 Body mass index (BMI) 19 or less, adult: Secondary | ICD-10-CM | POA: Diagnosis not present

## 2018-02-16 DIAGNOSIS — E7800 Pure hypercholesterolemia, unspecified: Secondary | ICD-10-CM | POA: Diagnosis not present

## 2018-02-16 DIAGNOSIS — E559 Vitamin D deficiency, unspecified: Secondary | ICD-10-CM | POA: Diagnosis not present

## 2018-02-16 DIAGNOSIS — M1710 Unilateral primary osteoarthritis, unspecified knee: Secondary | ICD-10-CM | POA: Diagnosis not present

## 2018-02-16 DIAGNOSIS — R69 Illness, unspecified: Secondary | ICD-10-CM | POA: Diagnosis not present

## 2018-02-17 ENCOUNTER — Telehealth: Payer: Self-pay | Admitting: Obstetrics and Gynecology

## 2018-02-17 NOTE — Telephone Encounter (Signed)
Patient called and stated that her medical doctor told her that she no longer needed a Pap and will not perform one any longer. Patient would like to know if Dr. Quincy Simmonds agrees that she does not need another Pap and if she does, how often.

## 2018-02-17 NOTE — Telephone Encounter (Signed)
Spoke with patient. Advised patient pap may be discontinued after age 67, but our office still recommends them due to continued risk of cervical cancer after this age. Last AEX with pap 02/07/17 with Dr. Quincy Simmonds. Patient reports pap and breast exam with PCP 07/2017.   Patient declines to schedule next AEX at this time, will return call to office.   Routing to provider for final review. Patient is agreeable to disposition. Will close encounter.

## 2018-03-04 DIAGNOSIS — R69 Illness, unspecified: Secondary | ICD-10-CM | POA: Diagnosis not present

## 2018-03-27 ENCOUNTER — Other Ambulatory Visit: Payer: Medicare HMO

## 2018-03-31 ENCOUNTER — Ambulatory Visit
Admission: RE | Admit: 2018-03-31 | Discharge: 2018-03-31 | Disposition: A | Discharge status: Home / Self Care | Payer: Medicare HMO | Source: Ambulatory Visit | Attending: Family Medicine | Admitting: Family Medicine

## 2018-03-31 DIAGNOSIS — Z780 Asymptomatic menopausal state: Secondary | ICD-10-CM | POA: Diagnosis not present

## 2018-03-31 DIAGNOSIS — M8589 Other specified disorders of bone density and structure, multiple sites: Secondary | ICD-10-CM | POA: Diagnosis not present

## 2018-03-31 DIAGNOSIS — M85861 Other specified disorders of bone density and structure, right lower leg: Secondary | ICD-10-CM

## 2018-03-31 DIAGNOSIS — M1711 Unilateral primary osteoarthritis, right knee: Secondary | ICD-10-CM | POA: Diagnosis not present

## 2018-04-16 DIAGNOSIS — H401133 Primary open-angle glaucoma, bilateral, severe stage: Secondary | ICD-10-CM | POA: Diagnosis not present

## 2018-04-16 DIAGNOSIS — H47233 Glaucomatous optic atrophy, bilateral: Secondary | ICD-10-CM | POA: Diagnosis not present

## 2018-06-11 ENCOUNTER — Encounter: Payer: Self-pay | Admitting: Podiatry

## 2018-06-11 ENCOUNTER — Ambulatory Visit: Payer: Medicare HMO | Admitting: Podiatry

## 2018-06-11 ENCOUNTER — Encounter

## 2018-06-11 DIAGNOSIS — Q828 Other specified congenital malformations of skin: Secondary | ICD-10-CM | POA: Diagnosis not present

## 2018-06-13 NOTE — Progress Notes (Signed)
She presents today for debridement of her reactive hyperkeratosis.  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  Pulses are present.  No open lesions or wounds.  Assessment: Porokeratosis.  Plan: Mechanical debridement of the lesion.

## 2018-06-17 DIAGNOSIS — Z1211 Encounter for screening for malignant neoplasm of colon: Secondary | ICD-10-CM | POA: Diagnosis not present

## 2018-06-17 DIAGNOSIS — D126 Benign neoplasm of colon, unspecified: Secondary | ICD-10-CM | POA: Diagnosis not present

## 2018-06-17 DIAGNOSIS — K5730 Diverticulosis of large intestine without perforation or abscess without bleeding: Secondary | ICD-10-CM | POA: Diagnosis not present

## 2018-06-19 DIAGNOSIS — D126 Benign neoplasm of colon, unspecified: Secondary | ICD-10-CM | POA: Diagnosis not present

## 2018-06-19 DIAGNOSIS — Z1211 Encounter for screening for malignant neoplasm of colon: Secondary | ICD-10-CM | POA: Diagnosis not present

## 2018-07-24 DIAGNOSIS — R69 Illness, unspecified: Secondary | ICD-10-CM | POA: Diagnosis not present

## 2018-08-18 DIAGNOSIS — I728 Aneurysm of other specified arteries: Secondary | ICD-10-CM | POA: Diagnosis not present

## 2018-08-18 DIAGNOSIS — Z23 Encounter for immunization: Secondary | ICD-10-CM | POA: Diagnosis not present

## 2018-08-18 DIAGNOSIS — E559 Vitamin D deficiency, unspecified: Secondary | ICD-10-CM | POA: Diagnosis not present

## 2018-08-18 DIAGNOSIS — Z681 Body mass index (BMI) 19 or less, adult: Secondary | ICD-10-CM | POA: Diagnosis not present

## 2018-08-18 DIAGNOSIS — M8580 Other specified disorders of bone density and structure, unspecified site: Secondary | ICD-10-CM | POA: Diagnosis not present

## 2018-08-18 DIAGNOSIS — E0590 Thyrotoxicosis, unspecified without thyrotoxic crisis or storm: Secondary | ICD-10-CM | POA: Diagnosis not present

## 2018-08-18 DIAGNOSIS — Z1339 Encounter for screening examination for other mental health and behavioral disorders: Secondary | ICD-10-CM | POA: Diagnosis not present

## 2018-08-18 DIAGNOSIS — E7800 Pure hypercholesterolemia, unspecified: Secondary | ICD-10-CM | POA: Diagnosis not present

## 2018-08-18 DIAGNOSIS — M1710 Unilateral primary osteoarthritis, unspecified knee: Secondary | ICD-10-CM | POA: Diagnosis not present

## 2018-08-20 DIAGNOSIS — H401133 Primary open-angle glaucoma, bilateral, severe stage: Secondary | ICD-10-CM | POA: Diagnosis not present

## 2018-09-01 DIAGNOSIS — R69 Illness, unspecified: Secondary | ICD-10-CM | POA: Diagnosis not present

## 2018-09-10 ENCOUNTER — Ambulatory Visit: Payer: Medicare HMO | Admitting: Podiatry

## 2018-09-10 ENCOUNTER — Encounter: Payer: Self-pay | Admitting: Podiatry

## 2018-09-10 DIAGNOSIS — Q828 Other specified congenital malformations of skin: Secondary | ICD-10-CM

## 2018-09-13 NOTE — Progress Notes (Signed)
She presents today states that I have this painful callus again and needs to be trimmed  Objective: Vital signs are stable she is alert and oriented x3 porokeratotic lesion plantar aspect of the left foot.  Assessment: Porokeratosis painful in nature.  Plan: Mechanical debridement of lesion today follow-up with her on an as-needed basis no iatrogenic lesions.

## 2018-09-28 DIAGNOSIS — H9202 Otalgia, left ear: Secondary | ICD-10-CM | POA: Diagnosis not present

## 2018-09-30 DIAGNOSIS — R69 Illness, unspecified: Secondary | ICD-10-CM | POA: Diagnosis not present

## 2018-10-13 DIAGNOSIS — H401133 Primary open-angle glaucoma, bilateral, severe stage: Secondary | ICD-10-CM | POA: Diagnosis not present

## 2018-10-13 DIAGNOSIS — H26492 Other secondary cataract, left eye: Secondary | ICD-10-CM | POA: Diagnosis not present

## 2018-11-26 DIAGNOSIS — H401133 Primary open-angle glaucoma, bilateral, severe stage: Secondary | ICD-10-CM | POA: Diagnosis not present

## 2018-12-15 ENCOUNTER — Ambulatory Visit: Payer: Medicare HMO | Admitting: Podiatry

## 2018-12-25 ENCOUNTER — Other Ambulatory Visit: Payer: Self-pay | Admitting: Obstetrics and Gynecology

## 2018-12-25 DIAGNOSIS — Z1231 Encounter for screening mammogram for malignant neoplasm of breast: Secondary | ICD-10-CM

## 2019-02-09 ENCOUNTER — Ambulatory Visit: Payer: Medicare HMO

## 2019-03-09 ENCOUNTER — Ambulatory Visit: Payer: Medicare HMO | Admitting: Podiatry

## 2019-03-31 ENCOUNTER — Ambulatory Visit: Payer: Medicare HMO

## 2019-04-19 ENCOUNTER — Ambulatory Visit: Payer: Medicare HMO

## 2019-04-24 ENCOUNTER — Ambulatory Visit
Admission: RE | Admit: 2019-04-24 | Discharge: 2019-04-24 | Disposition: A | Discharge status: Home / Self Care | Payer: Medicare HMO | Source: Ambulatory Visit | Attending: Obstetrics and Gynecology | Admitting: Obstetrics and Gynecology

## 2019-04-24 ENCOUNTER — Other Ambulatory Visit: Payer: Self-pay

## 2019-04-24 ENCOUNTER — Ambulatory Visit: Payer: Medicare HMO

## 2019-04-24 DIAGNOSIS — Z1231 Encounter for screening mammogram for malignant neoplasm of breast: Secondary | ICD-10-CM

## 2019-05-10 ENCOUNTER — Ambulatory Visit: Payer: Medicare HMO

## 2019-08-18 ENCOUNTER — Ambulatory Visit: Payer: Medicare HMO | Admitting: Obstetrics and Gynecology

## 2019-09-09 ENCOUNTER — Other Ambulatory Visit: Payer: Self-pay
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
## 2019-09-13 ENCOUNTER — Other Ambulatory Visit: Payer: Self-pay

## 2019-09-13 ENCOUNTER — Ambulatory Visit (INDEPENDENT_AMBULATORY_CARE_PROVIDER_SITE_OTHER): Payer: Medicare Other | Admitting: Obstetrics and Gynecology

## 2019-09-13 ENCOUNTER — Encounter: Payer: Self-pay | Admitting: Obstetrics and Gynecology

## 2019-09-13 VITALS — BP 132/74 | HR 70 | Temp 97.0°F | Resp 14 | Ht 65.5 in | Wt 121.0 lb

## 2019-09-13 DIAGNOSIS — Z01419 Encounter for gynecological examination (general) (routine) without abnormal findings: Secondary | ICD-10-CM

## 2019-09-13 NOTE — Patient Instructions (Signed)

## 2019-12-14 DIAGNOSIS — H47233 Glaucomatous optic atrophy, bilateral: Secondary | ICD-10-CM | POA: Diagnosis not present

## 2019-12-14 DIAGNOSIS — H401133 Primary open-angle glaucoma, bilateral, severe stage: Secondary | ICD-10-CM | POA: Diagnosis not present

## 2019-12-14 DIAGNOSIS — H26492 Other secondary cataract, left eye: Secondary | ICD-10-CM | POA: Diagnosis not present

## 2019-12-21 DIAGNOSIS — M7750 Other enthesopathy of unspecified foot: Secondary | ICD-10-CM | POA: Diagnosis not present

## 2020-01-04 DIAGNOSIS — M1711 Unilateral primary osteoarthritis, right knee: Secondary | ICD-10-CM | POA: Diagnosis not present

## 2020-01-05 DIAGNOSIS — M25561 Pain in right knee: Secondary | ICD-10-CM | POA: Diagnosis not present

## 2020-01-05 DIAGNOSIS — M6281 Muscle weakness (generalized): Secondary | ICD-10-CM | POA: Diagnosis not present

## 2020-01-11 DIAGNOSIS — M25561 Pain in right knee: Secondary | ICD-10-CM | POA: Diagnosis not present

## 2020-01-11 DIAGNOSIS — M6281 Muscle weakness (generalized): Secondary | ICD-10-CM | POA: Diagnosis not present

## 2020-01-13 DIAGNOSIS — M6281 Muscle weakness (generalized): Secondary | ICD-10-CM | POA: Diagnosis not present

## 2020-01-13 DIAGNOSIS — M25561 Pain in right knee: Secondary | ICD-10-CM | POA: Diagnosis not present

## 2020-01-18 DIAGNOSIS — M25561 Pain in right knee: Secondary | ICD-10-CM | POA: Diagnosis not present

## 2020-01-18 DIAGNOSIS — M6281 Muscle weakness (generalized): Secondary | ICD-10-CM | POA: Diagnosis not present

## 2020-01-20 DIAGNOSIS — M6281 Muscle weakness (generalized): Secondary | ICD-10-CM | POA: Diagnosis not present

## 2020-01-20 DIAGNOSIS — M25561 Pain in right knee: Secondary | ICD-10-CM | POA: Diagnosis not present

## 2020-01-25 DIAGNOSIS — M6281 Muscle weakness (generalized): Secondary | ICD-10-CM | POA: Diagnosis not present

## 2020-01-25 DIAGNOSIS — M25561 Pain in right knee: Secondary | ICD-10-CM | POA: Diagnosis not present

## 2020-01-27 DIAGNOSIS — M6281 Muscle weakness (generalized): Secondary | ICD-10-CM | POA: Diagnosis not present

## 2020-01-27 DIAGNOSIS — M25561 Pain in right knee: Secondary | ICD-10-CM | POA: Diagnosis not present

## 2020-02-01 DIAGNOSIS — M25561 Pain in right knee: Secondary | ICD-10-CM | POA: Diagnosis not present

## 2020-02-01 DIAGNOSIS — M6281 Muscle weakness (generalized): Secondary | ICD-10-CM | POA: Diagnosis not present

## 2020-02-03 DIAGNOSIS — M25561 Pain in right knee: Secondary | ICD-10-CM | POA: Diagnosis not present

## 2020-02-03 DIAGNOSIS — M6281 Muscle weakness (generalized): Secondary | ICD-10-CM | POA: Diagnosis not present

## 2020-02-08 DIAGNOSIS — M6281 Muscle weakness (generalized): Secondary | ICD-10-CM | POA: Diagnosis not present

## 2020-02-08 DIAGNOSIS — M25561 Pain in right knee: Secondary | ICD-10-CM | POA: Diagnosis not present

## 2020-02-15 DIAGNOSIS — M1711 Unilateral primary osteoarthritis, right knee: Secondary | ICD-10-CM | POA: Diagnosis not present

## 2020-02-22 DIAGNOSIS — E559 Vitamin D deficiency, unspecified: Secondary | ICD-10-CM | POA: Diagnosis not present

## 2020-02-22 DIAGNOSIS — M8580 Other specified disorders of bone density and structure, unspecified site: Secondary | ICD-10-CM | POA: Diagnosis not present

## 2020-02-22 DIAGNOSIS — Z9181 History of falling: Secondary | ICD-10-CM | POA: Diagnosis not present

## 2020-02-22 DIAGNOSIS — Z139 Encounter for screening, unspecified: Secondary | ICD-10-CM | POA: Diagnosis not present

## 2020-02-22 DIAGNOSIS — E0590 Thyrotoxicosis, unspecified without thyrotoxic crisis or storm: Secondary | ICD-10-CM | POA: Diagnosis not present

## 2020-02-22 DIAGNOSIS — Z1331 Encounter for screening for depression: Secondary | ICD-10-CM | POA: Diagnosis not present

## 2020-02-22 DIAGNOSIS — M1710 Unilateral primary osteoarthritis, unspecified knee: Secondary | ICD-10-CM | POA: Diagnosis not present

## 2020-02-22 DIAGNOSIS — Z681 Body mass index (BMI) 19 or less, adult: Secondary | ICD-10-CM | POA: Diagnosis not present

## 2020-02-22 DIAGNOSIS — E7800 Pure hypercholesterolemia, unspecified: Secondary | ICD-10-CM | POA: Diagnosis not present

## 2020-02-22 DIAGNOSIS — I728 Aneurysm of other specified arteries: Secondary | ICD-10-CM | POA: Diagnosis not present

## 2020-02-24 DIAGNOSIS — M25561 Pain in right knee: Secondary | ICD-10-CM | POA: Diagnosis not present

## 2020-02-24 DIAGNOSIS — M6281 Muscle weakness (generalized): Secondary | ICD-10-CM | POA: Diagnosis not present

## 2020-03-03 DIAGNOSIS — M25561 Pain in right knee: Secondary | ICD-10-CM | POA: Diagnosis not present

## 2020-03-03 DIAGNOSIS — M6281 Muscle weakness (generalized): Secondary | ICD-10-CM | POA: Diagnosis not present

## 2020-03-16 ENCOUNTER — Other Ambulatory Visit: Payer: Self-pay | Admitting: Physician Assistant

## 2020-03-16 DIAGNOSIS — E2839 Other primary ovarian failure: Secondary | ICD-10-CM

## 2020-03-16 DIAGNOSIS — Z1231 Encounter for screening mammogram for malignant neoplasm of breast: Secondary | ICD-10-CM

## 2020-04-10 DIAGNOSIS — R69 Illness, unspecified: Secondary | ICD-10-CM | POA: Diagnosis not present

## 2020-04-18 DIAGNOSIS — H47233 Glaucomatous optic atrophy, bilateral: Secondary | ICD-10-CM | POA: Diagnosis not present

## 2020-04-18 DIAGNOSIS — H401133 Primary open-angle glaucoma, bilateral, severe stage: Secondary | ICD-10-CM | POA: Diagnosis not present

## 2020-04-18 DIAGNOSIS — H26492 Other secondary cataract, left eye: Secondary | ICD-10-CM | POA: Diagnosis not present

## 2020-05-25 DIAGNOSIS — H47233 Glaucomatous optic atrophy, bilateral: Secondary | ICD-10-CM | POA: Diagnosis not present

## 2020-05-25 DIAGNOSIS — H401133 Primary open-angle glaucoma, bilateral, severe stage: Secondary | ICD-10-CM | POA: Diagnosis not present

## 2020-05-25 DIAGNOSIS — H26492 Other secondary cataract, left eye: Secondary | ICD-10-CM | POA: Diagnosis not present

## 2020-05-30 ENCOUNTER — Ambulatory Visit
Admission: RE | Admit: 2020-05-30 | Discharge: 2020-05-30 | Disposition: A | Discharge status: Home / Self Care | Payer: Medicare Other | Source: Ambulatory Visit | Attending: Physician Assistant | Admitting: Physician Assistant

## 2020-05-30 ENCOUNTER — Other Ambulatory Visit: Payer: Self-pay | Admitting: Physician Assistant

## 2020-05-30 ENCOUNTER — Other Ambulatory Visit: Payer: Self-pay

## 2020-05-30 DIAGNOSIS — M8580 Other specified disorders of bone density and structure, unspecified site: Secondary | ICD-10-CM

## 2020-05-30 DIAGNOSIS — M8588 Other specified disorders of bone density and structure, other site: Secondary | ICD-10-CM | POA: Diagnosis not present

## 2020-05-30 DIAGNOSIS — E2839 Other primary ovarian failure: Secondary | ICD-10-CM

## 2020-05-30 DIAGNOSIS — Z1231 Encounter for screening mammogram for malignant neoplasm of breast: Secondary | ICD-10-CM | POA: Diagnosis not present

## 2020-05-30 DIAGNOSIS — Z780 Asymptomatic menopausal state: Secondary | ICD-10-CM | POA: Diagnosis not present

## 2020-05-30 DIAGNOSIS — M810 Age-related osteoporosis without current pathological fracture: Secondary | ICD-10-CM | POA: Diagnosis not present

## 2020-06-13 DIAGNOSIS — M1711 Unilateral primary osteoarthritis, right knee: Secondary | ICD-10-CM | POA: Diagnosis not present

## 2020-06-27 DIAGNOSIS — H401133 Primary open-angle glaucoma, bilateral, severe stage: Secondary | ICD-10-CM | POA: Diagnosis not present

## 2020-06-27 DIAGNOSIS — H47233 Glaucomatous optic atrophy, bilateral: Secondary | ICD-10-CM | POA: Diagnosis not present

## 2020-06-27 DIAGNOSIS — H26492 Other secondary cataract, left eye: Secondary | ICD-10-CM | POA: Diagnosis not present

## 2020-07-10 DIAGNOSIS — R69 Illness, unspecified: Secondary | ICD-10-CM | POA: Diagnosis not present

## 2020-07-25 DIAGNOSIS — H47233 Glaucomatous optic atrophy, bilateral: Secondary | ICD-10-CM | POA: Diagnosis not present

## 2020-07-25 DIAGNOSIS — H401133 Primary open-angle glaucoma, bilateral, severe stage: Secondary | ICD-10-CM | POA: Diagnosis not present

## 2020-07-25 DIAGNOSIS — H26492 Other secondary cataract, left eye: Secondary | ICD-10-CM | POA: Diagnosis not present

## 2020-08-07 ENCOUNTER — Ambulatory Visit (INDEPENDENT_AMBULATORY_CARE_PROVIDER_SITE_OTHER): Payer: Medicare HMO

## 2020-08-07 ENCOUNTER — Ambulatory Visit (INDEPENDENT_AMBULATORY_CARE_PROVIDER_SITE_OTHER): Payer: Medicare HMO | Admitting: Podiatry

## 2020-08-07 ENCOUNTER — Other Ambulatory Visit: Payer: Self-pay

## 2020-08-07 DIAGNOSIS — M25571 Pain in right ankle and joints of right foot: Secondary | ICD-10-CM

## 2020-08-07 DIAGNOSIS — S93401A Sprain of unspecified ligament of right ankle, initial encounter: Secondary | ICD-10-CM

## 2020-08-08 ENCOUNTER — Encounter: Payer: Self-pay | Admitting: Podiatry

## 2020-08-08 NOTE — Progress Notes (Signed)
  Subjective:  Patient ID: Rebecca [name], female    DOB: 1951-07-27,  MRN: [id number]  Chief Complaint  Patient presents with  . Ankle Pain    Twisted and sprained right ankle about a week ago exam restaurant.  Is still quite sore and swollen    69 y.o. female presents with the above complaint. History confirmed with patient.   Objective:  Physical Exam: warm, good capillary refill, no trophic changes or ulcerative lesions, normal DP and PT pulses and normal sensory exam.  Medial lateral right ankle pain with pain over the deltoid complex.  No gross instability is noted.  There is no pain on the navicular tuberosity or fifth metatarsal base.  Minimal pain over the lateral ligament complex with a negative anterior drawer negative talar tilt.  No proximal fibular pain.  No pain over the syndesmosis.   Radiographs: Right ankle no evidence of fracture or dislocation  Assessment:   1. Right ankle pain, unspecified chronicity   2. Moderate right ankle sprain, initial encounter      Plan:  Patient was evaluated and treated and all questions answered.  -Discussed with her she has moderate to severe right ankle sprain this will likely take some time to heal completely.  I reviewed the rice protocol with her in detail and recommended she ice and elevate and wear an Ace wrap.  She currently has a CAM boot and will continue to wear this.  -Consider physical therapy if not improving by next visit.   Return in about 4 weeks (around 09/04/2020).

## 2020-08-24 ENCOUNTER — Other Ambulatory Visit: Payer: Self-pay | Admitting: Podiatry

## 2020-08-24 DIAGNOSIS — S93401A Sprain of unspecified ligament of right ankle, initial encounter: Secondary | ICD-10-CM

## 2020-09-04 ENCOUNTER — Other Ambulatory Visit: Payer: Self-pay

## 2020-09-04 ENCOUNTER — Encounter: Payer: Self-pay | Admitting: Podiatry

## 2020-09-04 ENCOUNTER — Ambulatory Visit: Payer: Medicare HMO | Admitting: Podiatry

## 2020-09-04 DIAGNOSIS — L84 Corns and callosities: Secondary | ICD-10-CM

## 2020-09-04 DIAGNOSIS — M25571 Pain in right ankle and joints of right foot: Secondary | ICD-10-CM | POA: Diagnosis not present

## 2020-09-04 DIAGNOSIS — R52 Pain, unspecified: Secondary | ICD-10-CM

## 2020-09-04 NOTE — Progress Notes (Signed)
  Subjective:  Patient ID: Rebecca [name], female    DOB: 05/22/1951,  MRN: [id number]  Chief Complaint  Patient presents with  . Ankle Pain    "my ankle is alot better and I need him to trim my callouses"  . Callouses    69 y.o. female returns with the above complaint. History confirmed with patient.  Ankle is feeling much better.  The only issue now is the calluses.   Objective:  Physical Exam: warm, good capillary refill, no trophic changes or ulcerative lesions, normal DP and PT pulses and normal sensory exam.  No pain or instability today.  Calluses present fifth met base bilaterally.   Radiographs: Right ankle no evidence of fracture or dislocation  Assessment:   1. Callus of foot   2. Right ankle pain, unspecified chronicity      Plan:  Patient was evaluated and treated and all questions answered.  -Ankle sprain is healed completely.  Resume full activity.  Return to regular exercise on stationary bike and treadmill.  All symptomatic hyperkeratoses were safely debrided with a sterile #15 blade to patient's level of comfort without incident. We discussed preventative and palliative care of these lesions including supportive and accommodative shoegear, padding, prefabricated and custom molded accommodative orthoses, use of a pumice stone and lotions/creams daily.  Recommended urea cream    Return in about 3 months (around 12/05/2020).

## 2020-09-04 NOTE — Patient Instructions (Signed)
Look for urea 40% cream or ointment and apply to the thickened dry skin / calluses. This can be bought over the counter, at a pharmacy or online such as [employer].  

## 2020-11-23 DIAGNOSIS — H26492 Other secondary cataract, left eye: Secondary | ICD-10-CM | POA: Diagnosis not present

## 2020-11-23 DIAGNOSIS — H401133 Primary open-angle glaucoma, bilateral, severe stage: Secondary | ICD-10-CM | POA: Diagnosis not present

## 2020-11-23 DIAGNOSIS — H47233 Glaucomatous optic atrophy, bilateral: Secondary | ICD-10-CM | POA: Diagnosis not present

## 2020-12-05 ENCOUNTER — Ambulatory Visit: Payer: Medicare HMO | Admitting: Podiatry

## 2020-12-20 DIAGNOSIS — M25561 Pain in right knee: Secondary | ICD-10-CM | POA: Diagnosis not present

## 2021-01-05 DIAGNOSIS — M1711 Unilateral primary osteoarthritis, right knee: Secondary | ICD-10-CM | POA: Diagnosis not present

## 2021-01-24 DIAGNOSIS — Z6820 Body mass index (BMI) 20.0-20.9, adult: Secondary | ICD-10-CM | POA: Diagnosis not present

## 2021-01-24 DIAGNOSIS — I728 Aneurysm of other specified arteries: Secondary | ICD-10-CM | POA: Diagnosis not present

## 2021-01-24 DIAGNOSIS — E559 Vitamin D deficiency, unspecified: Secondary | ICD-10-CM | POA: Diagnosis not present

## 2021-01-24 DIAGNOSIS — M1710 Unilateral primary osteoarthritis, unspecified knee: Secondary | ICD-10-CM | POA: Diagnosis not present

## 2021-01-24 DIAGNOSIS — E7800 Pure hypercholesterolemia, unspecified: Secondary | ICD-10-CM | POA: Diagnosis not present

## 2021-01-24 DIAGNOSIS — Z01818 Encounter for other preprocedural examination: Secondary | ICD-10-CM | POA: Diagnosis not present

## 2021-01-24 DIAGNOSIS — E0590 Thyrotoxicosis, unspecified without thyrotoxic crisis or storm: Secondary | ICD-10-CM | POA: Diagnosis not present

## 2021-01-24 DIAGNOSIS — M810 Age-related osteoporosis without current pathological fracture: Secondary | ICD-10-CM | POA: Diagnosis not present

## 2021-02-12 DIAGNOSIS — R791 Abnormal coagulation profile: Secondary | ICD-10-CM | POA: Diagnosis not present

## 2021-02-12 DIAGNOSIS — M1711 Unilateral primary osteoarthritis, right knee: Secondary | ICD-10-CM | POA: Diagnosis not present

## 2021-02-12 DIAGNOSIS — Z01812 Encounter for preprocedural laboratory examination: Secondary | ICD-10-CM | POA: Diagnosis not present

## 2021-03-01 DIAGNOSIS — G8918 Other acute postprocedural pain: Secondary | ICD-10-CM | POA: Diagnosis not present

## 2021-03-01 DIAGNOSIS — M1711 Unilateral primary osteoarthritis, right knee: Secondary | ICD-10-CM | POA: Diagnosis not present

## 2021-03-05 DIAGNOSIS — M25561 Pain in right knee: Secondary | ICD-10-CM | POA: Diagnosis not present

## 2021-03-05 DIAGNOSIS — M6281 Muscle weakness (generalized): Secondary | ICD-10-CM | POA: Diagnosis not present

## 2021-03-09 DIAGNOSIS — M6281 Muscle weakness (generalized): Secondary | ICD-10-CM | POA: Diagnosis not present

## 2021-03-09 DIAGNOSIS — M25561 Pain in right knee: Secondary | ICD-10-CM | POA: Diagnosis not present

## 2021-03-12 DIAGNOSIS — M25561 Pain in right knee: Secondary | ICD-10-CM | POA: Diagnosis not present

## 2021-03-12 DIAGNOSIS — M6281 Muscle weakness (generalized): Secondary | ICD-10-CM | POA: Diagnosis not present

## 2021-03-14 DIAGNOSIS — M1711 Unilateral primary osteoarthritis, right knee: Secondary | ICD-10-CM | POA: Diagnosis not present

## 2021-03-16 DIAGNOSIS — M25561 Pain in right knee: Secondary | ICD-10-CM | POA: Diagnosis not present

## 2021-03-16 DIAGNOSIS — M6281 Muscle weakness (generalized): Secondary | ICD-10-CM | POA: Diagnosis not present

## 2021-03-19 DIAGNOSIS — M25561 Pain in right knee: Secondary | ICD-10-CM | POA: Diagnosis not present

## 2021-03-19 DIAGNOSIS — M6281 Muscle weakness (generalized): Secondary | ICD-10-CM | POA: Diagnosis not present

## 2021-03-23 DIAGNOSIS — M25561 Pain in right knee: Secondary | ICD-10-CM | POA: Diagnosis not present

## 2021-03-23 DIAGNOSIS — M6281 Muscle weakness (generalized): Secondary | ICD-10-CM | POA: Diagnosis not present

## 2021-03-27 DIAGNOSIS — H401133 Primary open-angle glaucoma, bilateral, severe stage: Secondary | ICD-10-CM | POA: Diagnosis not present

## 2021-03-27 DIAGNOSIS — H47233 Glaucomatous optic atrophy, bilateral: Secondary | ICD-10-CM | POA: Diagnosis not present

## 2021-03-28 DIAGNOSIS — M25561 Pain in right knee: Secondary | ICD-10-CM | POA: Diagnosis not present

## 2021-03-28 DIAGNOSIS — M6281 Muscle weakness (generalized): Secondary | ICD-10-CM | POA: Diagnosis not present

## 2021-03-30 DIAGNOSIS — M6281 Muscle weakness (generalized): Secondary | ICD-10-CM | POA: Diagnosis not present

## 2021-03-30 DIAGNOSIS — M25561 Pain in right knee: Secondary | ICD-10-CM | POA: Diagnosis not present

## 2021-04-04 DIAGNOSIS — N390 Urinary tract infection, site not specified: Secondary | ICD-10-CM | POA: Diagnosis not present

## 2021-04-06 DIAGNOSIS — M6281 Muscle weakness (generalized): Secondary | ICD-10-CM | POA: Diagnosis not present

## 2021-04-06 DIAGNOSIS — M25561 Pain in right knee: Secondary | ICD-10-CM | POA: Diagnosis not present

## 2021-04-09 DIAGNOSIS — M6281 Muscle weakness (generalized): Secondary | ICD-10-CM | POA: Diagnosis not present

## 2021-04-09 DIAGNOSIS — M25561 Pain in right knee: Secondary | ICD-10-CM | POA: Diagnosis not present

## 2021-04-11 DIAGNOSIS — M1711 Unilateral primary osteoarthritis, right knee: Secondary | ICD-10-CM | POA: Diagnosis not present

## 2021-04-18 ENCOUNTER — Encounter: Payer: Medicare HMO | Admitting: Family Medicine

## 2021-04-25 DIAGNOSIS — E0590 Thyrotoxicosis, unspecified without thyrotoxic crisis or storm: Secondary | ICD-10-CM | POA: Diagnosis not present

## 2021-04-25 DIAGNOSIS — E559 Vitamin D deficiency, unspecified: Secondary | ICD-10-CM | POA: Diagnosis not present

## 2021-04-25 DIAGNOSIS — E7800 Pure hypercholesterolemia, unspecified: Secondary | ICD-10-CM | POA: Diagnosis not present

## 2021-04-25 DIAGNOSIS — M810 Age-related osteoporosis without current pathological fracture: Secondary | ICD-10-CM | POA: Diagnosis not present

## 2021-04-25 DIAGNOSIS — I728 Aneurysm of other specified arteries: Secondary | ICD-10-CM | POA: Diagnosis not present

## 2021-04-25 DIAGNOSIS — Z1331 Encounter for screening for depression: Secondary | ICD-10-CM | POA: Diagnosis not present

## 2021-04-25 DIAGNOSIS — N390 Urinary tract infection, site not specified: Secondary | ICD-10-CM | POA: Diagnosis not present

## 2021-05-01 DIAGNOSIS — H401133 Primary open-angle glaucoma, bilateral, severe stage: Secondary | ICD-10-CM | POA: Diagnosis not present

## 2021-05-01 DIAGNOSIS — H47233 Glaucomatous optic atrophy, bilateral: Secondary | ICD-10-CM | POA: Diagnosis not present

## 2021-05-02 DIAGNOSIS — T782XXA Anaphylactic shock, unspecified, initial encounter: Secondary | ICD-10-CM | POA: Diagnosis not present

## 2021-05-02 DIAGNOSIS — M25561 Pain in right knee: Secondary | ICD-10-CM | POA: Diagnosis not present

## 2021-05-02 DIAGNOSIS — T7840XA Allergy, unspecified, initial encounter: Secondary | ICD-10-CM | POA: Diagnosis not present

## 2021-05-02 DIAGNOSIS — M6281 Muscle weakness (generalized): Secondary | ICD-10-CM | POA: Diagnosis not present

## 2021-05-02 DIAGNOSIS — R059 Cough, unspecified: Secondary | ICD-10-CM | POA: Diagnosis not present

## 2021-05-02 DIAGNOSIS — L299 Pruritus, unspecified: Secondary | ICD-10-CM | POA: Diagnosis not present

## 2021-05-02 DIAGNOSIS — X58XXXA Exposure to other specified factors, initial encounter: Secondary | ICD-10-CM | POA: Diagnosis not present

## 2021-05-08 DIAGNOSIS — M25561 Pain in right knee: Secondary | ICD-10-CM | POA: Diagnosis not present

## 2021-05-08 DIAGNOSIS — M6281 Muscle weakness (generalized): Secondary | ICD-10-CM | POA: Diagnosis not present

## 2021-05-11 DIAGNOSIS — M25561 Pain in right knee: Secondary | ICD-10-CM | POA: Diagnosis not present

## 2021-05-11 DIAGNOSIS — M6281 Muscle weakness (generalized): Secondary | ICD-10-CM | POA: Diagnosis not present

## 2021-05-15 DIAGNOSIS — M25561 Pain in right knee: Secondary | ICD-10-CM | POA: Diagnosis not present

## 2021-05-15 DIAGNOSIS — M6281 Muscle weakness (generalized): Secondary | ICD-10-CM | POA: Diagnosis not present

## 2021-05-17 DIAGNOSIS — M6281 Muscle weakness (generalized): Secondary | ICD-10-CM | POA: Diagnosis not present

## 2021-05-17 DIAGNOSIS — M25561 Pain in right knee: Secondary | ICD-10-CM | POA: Diagnosis not present

## 2021-05-23 DIAGNOSIS — M1711 Unilateral primary osteoarthritis, right knee: Secondary | ICD-10-CM | POA: Diagnosis not present

## 2021-05-29 ENCOUNTER — Other Ambulatory Visit: Payer: Self-pay | Admitting: Physician Assistant

## 2021-05-29 DIAGNOSIS — Z1231 Encounter for screening mammogram for malignant neoplasm of breast: Secondary | ICD-10-CM

## 2021-07-19 ENCOUNTER — Ambulatory Visit
Admission: RE | Admit: 2021-07-19 | Discharge: 2021-07-19 | Disposition: A | Discharge status: Home / Self Care | Payer: Medicare HMO | Source: Ambulatory Visit | Attending: Physician Assistant | Admitting: Physician Assistant

## 2021-07-19 ENCOUNTER — Ambulatory Visit: Payer: Medicare HMO | Admitting: Sports Medicine

## 2021-07-19 ENCOUNTER — Other Ambulatory Visit: Payer: Self-pay

## 2021-07-19 VITALS — Ht 66.0 in | Wt 114.0 lb

## 2021-07-19 DIAGNOSIS — R269 Unspecified abnormalities of gait and mobility: Secondary | ICD-10-CM | POA: Diagnosis not present

## 2021-07-19 DIAGNOSIS — Z1231 Encounter for screening mammogram for malignant neoplasm of breast: Secondary | ICD-10-CM | POA: Diagnosis not present

## 2021-07-19 NOTE — Progress Notes (Signed)
Patient ID: Rebecca [name], female   DOB: [birth year], 70 y.o.   MRN: [id number]  Rebecca [name] presents today for custom orthotics.  She has had custom orthotics in the past.  She is here today at the request of Dr. Mardelle Matte.  She is status post right knee unicompartmental replacement approximately 4 months ago.  She is doing well.  She is also status post left knee total knee arthroplasty done in 2011.  Unfortunately, she continues to have pain in this knee.  Custom orthotics were created as below.  It was neutral with orthotics in place.  Follow-up with Dr. Mardelle Matte as scheduled and follow-up with me as needed.  Patient was fitted for a : standard, cushioned, semi-rigid orthotic. The orthotic was heated and afterward the patient stood on the orthotic blank positioned on the orthotic stand. The patient was positioned in subtalar neutral position and 10 degrees of ankle dorsiflexion in a weight bearing stance. After completion of molding, a stable base was applied to the orthotic blank. The blank was ground to a stable position for weight bearing. Size: 7 Base: Blue EVA Posting: none Additional orthotic padding: B/L scaphoid pads  This note was dictated using Dragon naturally speaking software and may contain errors in syntax, spelling, or content which have not been identified prior to signing this note.

## 2021-07-24 ENCOUNTER — Encounter: Payer: Medicare HMO | Admitting: Sports Medicine

## 2021-09-04 DIAGNOSIS — H47233 Glaucomatous optic atrophy, bilateral: Secondary | ICD-10-CM | POA: Diagnosis not present

## 2021-09-04 DIAGNOSIS — H401133 Primary open-angle glaucoma, bilateral, severe stage: Secondary | ICD-10-CM | POA: Diagnosis not present

## 2021-09-17 DIAGNOSIS — M25561 Pain in right knee: Secondary | ICD-10-CM | POA: Diagnosis not present

## 2021-09-26 DIAGNOSIS — M25561 Pain in right knee: Secondary | ICD-10-CM | POA: Diagnosis not present

## 2021-10-03 DIAGNOSIS — M1711 Unilateral primary osteoarthritis, right knee: Secondary | ICD-10-CM | POA: Diagnosis not present

## 2021-10-03 DIAGNOSIS — M25561 Pain in right knee: Secondary | ICD-10-CM | POA: Diagnosis not present

## 2021-10-11 DIAGNOSIS — M25561 Pain in right knee: Secondary | ICD-10-CM | POA: Diagnosis not present

## 2021-10-12 ENCOUNTER — Other Ambulatory Visit: Payer: Self-pay | Admitting: Orthopedic Surgery

## 2021-10-12 DIAGNOSIS — M25561 Pain in right knee: Secondary | ICD-10-CM

## 2021-10-15 ENCOUNTER — Other Ambulatory Visit: Payer: Self-pay

## 2021-10-15 ENCOUNTER — Ambulatory Visit
Admission: RE | Admit: 2021-10-15 | Discharge: 2021-10-15 | Disposition: A | Discharge status: Home / Self Care | Payer: Medicare HMO | Source: Ambulatory Visit | Attending: Orthopedic Surgery | Admitting: Orthopedic Surgery

## 2021-10-15 DIAGNOSIS — R600 Localized edema: Secondary | ICD-10-CM | POA: Diagnosis not present

## 2021-10-15 DIAGNOSIS — Z01818 Encounter for other preprocedural examination: Secondary | ICD-10-CM | POA: Diagnosis not present

## 2021-10-15 DIAGNOSIS — M25461 Effusion, right knee: Secondary | ICD-10-CM | POA: Diagnosis not present

## 2021-10-15 DIAGNOSIS — M7121 Synovial cyst of popliteal space [Baker], right knee: Secondary | ICD-10-CM | POA: Diagnosis not present

## 2021-10-15 DIAGNOSIS — M25561 Pain in right knee: Secondary | ICD-10-CM

## 2021-10-18 DIAGNOSIS — T8484XA Pain due to internal orthopedic prosthetic devices, implants and grafts, initial encounter: Secondary | ICD-10-CM | POA: Diagnosis not present

## 2021-10-18 DIAGNOSIS — E559 Vitamin D deficiency, unspecified: Secondary | ICD-10-CM | POA: Diagnosis not present

## 2021-10-18 DIAGNOSIS — E7800 Pure hypercholesterolemia, unspecified: Secondary | ICD-10-CM | POA: Diagnosis not present

## 2021-10-18 DIAGNOSIS — Z681 Body mass index (BMI) 19 or less, adult: Secondary | ICD-10-CM | POA: Diagnosis not present

## 2021-10-18 DIAGNOSIS — M810 Age-related osteoporosis without current pathological fracture: Secondary | ICD-10-CM | POA: Diagnosis not present

## 2021-10-18 DIAGNOSIS — E0590 Thyrotoxicosis, unspecified without thyrotoxic crisis or storm: Secondary | ICD-10-CM | POA: Diagnosis not present

## 2021-10-18 DIAGNOSIS — M25561 Pain in right knee: Secondary | ICD-10-CM | POA: Diagnosis not present

## 2021-10-18 DIAGNOSIS — Z79899 Other long term (current) drug therapy: Secondary | ICD-10-CM | POA: Diagnosis not present

## 2021-10-31 DIAGNOSIS — M1711 Unilateral primary osteoarthritis, right knee: Secondary | ICD-10-CM | POA: Diagnosis not present

## 2021-11-06 DIAGNOSIS — Z681 Body mass index (BMI) 19 or less, adult: Secondary | ICD-10-CM | POA: Diagnosis not present

## 2021-11-06 DIAGNOSIS — N390 Urinary tract infection, site not specified: Secondary | ICD-10-CM | POA: Diagnosis not present

## 2021-11-06 DIAGNOSIS — R350 Frequency of micturition: Secondary | ICD-10-CM | POA: Diagnosis not present

## 2021-12-12 DIAGNOSIS — R350 Frequency of micturition: Secondary | ICD-10-CM | POA: Diagnosis not present

## 2021-12-12 DIAGNOSIS — R3121 Asymptomatic microscopic hematuria: Secondary | ICD-10-CM | POA: Diagnosis not present

## 2021-12-12 DIAGNOSIS — N3941 Urge incontinence: Secondary | ICD-10-CM | POA: Diagnosis not present

## 2021-12-12 DIAGNOSIS — R8271 Bacteriuria: Secondary | ICD-10-CM | POA: Diagnosis not present

## 2021-12-26 ENCOUNTER — Encounter (HOSPITAL_BASED_OUTPATIENT_CLINIC_OR_DEPARTMENT_OTHER): Payer: Self-pay | Admitting: Emergency Medicine

## 2021-12-26 ENCOUNTER — Emergency Department (HOSPITAL_BASED_OUTPATIENT_CLINIC_OR_DEPARTMENT_OTHER): Payer: Medicare HMO

## 2021-12-26 ENCOUNTER — Other Ambulatory Visit: Payer: Self-pay

## 2021-12-26 ENCOUNTER — Emergency Department (HOSPITAL_BASED_OUTPATIENT_CLINIC_OR_DEPARTMENT_OTHER)
Admission: EM | Admit: 2021-12-26 | Discharge: 2021-12-26 | Disposition: A | Discharge status: Home / Self Care | Payer: Medicare HMO | Attending: Emergency Medicine | Admitting: Emergency Medicine

## 2021-12-26 DIAGNOSIS — R531 Weakness: Secondary | ICD-10-CM | POA: Diagnosis not present

## 2021-12-26 DIAGNOSIS — R69 Illness, unspecified: Secondary | ICD-10-CM | POA: Diagnosis not present

## 2021-12-26 DIAGNOSIS — R42 Dizziness and giddiness: Secondary | ICD-10-CM | POA: Diagnosis not present

## 2021-12-26 DIAGNOSIS — F0390 Unspecified dementia without behavioral disturbance: Secondary | ICD-10-CM | POA: Insufficient documentation

## 2021-12-26 LAB — URINALYSIS, ROUTINE W REFLEX MICROSCOPIC
Bilirubin Urine: NEGATIVE
Glucose, UA: NEGATIVE mg/dL
Leukocytes,Ua: NEGATIVE
Nitrite: NEGATIVE
Protein, ur: NEGATIVE mg/dL
Specific Gravity, Urine: 1.011 (ref 1.005–1.030)
pH: 7.5 (ref 5.0–8.0)

## 2021-12-26 LAB — CBC
HCT: 41.6 % (ref 36.0–46.0)
Hemoglobin: 13.4 g/dL (ref 12.0–15.0)
MCH: 30.7 pg (ref 26.0–34.0)
MCHC: 32.2 g/dL (ref 30.0–36.0)
MCV: 95.4 fL (ref 80.0–100.0)
Platelets: 267 10*3/uL (ref 150–400)
RBC: 4.36 MIL/uL (ref 3.87–5.11)
RDW: 12.5 % (ref 11.5–15.5)
WBC: 5.9 10*3/uL (ref 4.0–10.5)
nRBC: 0 % (ref 0.0–0.2)

## 2021-12-26 LAB — COMPREHENSIVE METABOLIC PANEL
ALT: 20 U/L (ref 0–44)
AST: 25 U/L (ref 15–41)
Albumin: 4.7 g/dL (ref 3.5–5.0)
Alkaline Phosphatase: 52 U/L (ref 38–126)
Anion gap: 10 (ref 5–15)
BUN: 22 mg/dL (ref 8–23)
CO2: 26 mmol/L (ref 22–32)
Calcium: 9.8 mg/dL (ref 8.9–10.3)
Chloride: 101 mmol/L (ref 98–111)
Creatinine, Ser: 0.48 mg/dL (ref 0.44–1.00)
GFR, Estimated: >60 mL/min (ref >60)
Glucose, Bld: 106 mg/dL — ABNORMAL HIGH (ref 70–99)
Potassium: 3.7 mmol/L (ref 3.5–5.1)
Sodium: 137 mmol/L (ref 135–145)
Total Bilirubin: 0.5 mg/dL (ref 0.3–1.2)
Total Protein: 7.2 g/dL (ref 6.5–8.1)

## 2021-12-26 NOTE — ED Triage Notes (Signed)
C/O generalized weakness upon waking this morning. Reported patient has had some difficulty with mobility as is, as well as history of vertigo. Patient reported feeling that upon waking as well. Denies recent falls, and or LOC.  ?

## 2021-12-26 NOTE — Discharge Instructions (Signed)
Your labs and evaluation did not show any acute abnormalities today ?A referral has been made for home health assessment ?Please call your physician for evaluation this week for assistance with possible placement in assisted living or other facility ?

## 2021-12-26 NOTE — ED Provider Notes (Signed)
[facility] Provider Note   CSN: [id number] Arrival date & time: 12/26/21  1136     History  Chief Complaint  Patient presents with   Weakness    Rebecca [name] is a 71 y.o. female.  HPI 71 year old female with history of mild dementia, knee replacement, walks with walker, reports of several recent urinary tract infections comes in today saying that she feels generally weaker.  No report of falls, head injury, headache, or lateralized weakness.    Home Medications Prior to Admission medications   Medication Sig Start Date End Date Taking? Authorizing Provider  acetaminophen (TYLENOL) 325 MG tablet Take 325 mg by mouth every 6 (six) hours as needed for pain.    [provider]  Calcium Carbonate-Vit D-Min (CALTRATE 600+D PLUS MINIS PO) Take 1 tablet by mouth daily.    [provider]  clindamycin (CLEOCIN) 150 MG capsule TAKE 4 CAPSULES BY MOUTH 1 HOUR PRIOR TO DENTAL APPOINTMENT 08/18/18   [provider]  meloxicam (MOBIC) 15 MG tablet TAKE 1 TABLET BY MOUTH EVERY DAY AS NEEDED FOR PAIN 08/18/18   [provider]  Tafluprost (ZIOPTAN) 0.0015 % SOLN Apply to eye daily.    [provider]  timolol (TIMOPTIC) 0.5 % ophthalmic solution  07/30/14   [provider]      Allergies    Myrbetriq [mirabegron er], Nitrofurantoin, Penicillins, and Sulfa antibiotics    Review of Systems   Review of Systems  Constitutional:  Negative for activity change, appetite change, chills, fatigue, fever and unexpected weight change.  HENT:  Negative for congestion.   Respiratory:  Negative for shortness of breath.   Cardiovascular:  Negative for chest pain.  Gastrointestinal:  Negative for abdominal distention, abdominal pain, nausea and vomiting.  Genitourinary:  Negative for dysuria.  Musculoskeletal:  Negative for back pain.  Skin:  Negative for color change and rash.  Neurological:  Positive for weakness.  Negative for syncope, numbness and headaches.   Physical Exam Updated Vital Signs BP (!) 144/82    Pulse 84    Temp 98.1 F (36.7 C) (Oral)    Resp 14    Ht 1.651 m (5\' 5" )    Wt 51.3 kg    LMP 10/28/2004 (Approximate)    SpO2 100%    BMI 18.80 kg/m  Physical Exam Vitals and nursing note reviewed.  Constitutional:      Appearance: She is well-developed.  HENT:     Head: Normocephalic and atraumatic.     Right Ear: Tympanic membrane and external ear normal.     Left Ear: Tympanic membrane and external ear normal.     Nose: Nose normal.     Right Sinus: No maxillary sinus tenderness or frontal sinus tenderness.     Left Sinus: No maxillary sinus tenderness or frontal sinus tenderness.  Eyes:     Extraocular Movements:     Right eye: No nystagmus.     Left eye: No nystagmus.     Conjunctiva/sclera: Conjunctivae normal.     Pupils: Pupils are equal, round, and reactive to light.  Cardiovascular:     Rate and Rhythm: Normal rate and regular rhythm.     Heart sounds: Normal heart sounds.  Pulmonary:     Effort: Pulmonary effort is normal. No respiratory distress.     Breath sounds: Normal breath sounds.  Chest:     Chest wall: No tenderness.  Abdominal:     General: Bowel sounds are normal. There  is no distension.     Palpations: Abdomen is soft. There is no mass.     Tenderness: There is no abdominal tenderness.  Musculoskeletal:        General: No tenderness. Normal range of motion.     Cervical back: Normal range of motion and neck supple.  Skin:    General: Skin is warm and dry.     Capillary Refill: Capillary refill takes less than 2 seconds.     Findings: No rash.  Neurological:     Mental Status: She is alert and oriented to person, place, and time.     GCS: GCS eye subscore is 4. GCS verbal subscore is 5. GCS motor subscore is 6.     Cranial Nerves: No cranial nerve deficit.     Sensory: No sensory deficit.     Motor: No weakness.     Coordination: Coordination  normal.     Gait: Gait normal.     Deep Tendon Reflexes: Reflexes are normal and symmetric. Reflexes normal. Babinski sign absent on the right side. Babinski sign absent on the left side.     Reflex Scores:      Tricep reflexes are 2+ on the right side and 2+ on the left side.      Bicep reflexes are 2+ on the right side and 2+ on the left side.      Brachioradialis reflexes are 2+ on the right side and 2+ on the left side.      Patellar reflexes are 2+ on the right side and 2+ on the left side.      Achilles reflexes are 2+ on the right side and 2+ on the left side.    Comments: Speech is normal and clear Patient walked in with a walker as per her usual gait  Psychiatric:        Behavior: Behavior normal.        Thought Content: Thought content normal.        Judgment: Judgment normal.    ED Results / Procedures / Treatments   Labs (all labs ordered are listed, but only abnormal results are displayed) Labs Reviewed  COMPREHENSIVE METABOLIC PANEL - Abnormal; Notable for the following components:      Result Value   Glucose, Bld 106 (*)    All other components within normal limits  URINALYSIS, ROUTINE W REFLEX MICROSCOPIC - Abnormal; Notable for the following components:   Color, Urine COLORLESS (*)    Hgb urine dipstick MODERATE (*)    Ketones, ur TRACE (*)    Bacteria, UA RARE (*)    All other components within normal limits  URINE CULTURE  CBC    EKG EKG Interpretation  Date/Time:  Wednesday December 26 2021 11:56:23 EST Ventricular Rate:  82 PR Interval:  132 QRS Duration: 92 QT Interval:  394 QTC Calculation: 461 R Axis:   66 Text Interpretation: Sinus rhythm Normal ECG Confirmed by Pattricia Boss [id number]) on 12/26/2021 3:01:16 PM  Radiology CT Head Wo Contrast  Result Date: 12/26/2021 CLINICAL DATA:  Dizziness EXAM: CT HEAD WITHOUT CONTRAST TECHNIQUE: Contiguous axial images were obtained from the base of the skull through the vertex without intravenous contrast.  RADIATION DOSE REDUCTION: This exam was performed according to the departmental dose-optimization program which includes automated exposure control, adjustment of the mA and/or kV according to patient size and/or use of iterative reconstruction technique. COMPARISON:  None. FINDINGS: Brain: No acute intracranial findings are seen. Cortical sulci are prominent. There  is prominence of third and both lateral ventricles. There is decreased density in the periventricular and subcortical white matter. Vascular: Unremarkable. Skull: Unremarkable. Sinuses/Orbits: Unremarkable. Other: None IMPRESSION: No acute intracranial findings are seen in noncontrast CT brain. Atrophy. Small-vessel disease. Electronically Signed   By: Elmer Picker M.D.   On: 12/26/2021 13:13   DG Chest Port 1 View  Result Date: 12/26/2021 CLINICAL DATA:  Generalized weakness EXAM: PORTABLE CHEST 1 VIEW COMPARISON:  07/12/2013 FINDINGS: The heart size and mediastinal contours are within normal limits. Both lungs are clear. The visualized skeletal structures are unremarkable. IMPRESSION: No active disease. Electronically Signed   By: Elmer Picker M.D.   On: 12/26/2021 13:05    Procedures Procedures    Medications Ordered in ED Medications - No data to display  ED Course/ Medical Decision Making/ A&P Clinical Course as of 12/26/21 1526  Wed Dec 26, 2021  1520  urine obtains 1120 red blood cells and 0-5 white blood cells with rare bacteria.  We will culture urine CBC normal Chemistry normal CT head without any evidence of acute intracranial abnormality [DR]  1521 CT personally reviewed no evidence of acute abnormality noted on my interpretation Radiologist interpretation with atrophy and small vessel disease [DR]  1521 Chest x-Terez Freimark reviewed and interpreted with no evidence of acute abnormality Radiologist interpretation shows no acute abnormality noted [DR]    Clinical Course User Index [DR] Pattricia Boss, MD                            Medical Decision Making 71 year old female complaining of some generalized weakness.  She is chronically walking with a walker and feels that her walking ability is decreased today.  However she was ambulatory into the department with no difficulty and is at baseline per her sister.  Evaluation here with labs and CT without any evidence of acute abnormality.  Urinalysis was obtained via clean-catch and there are couple red blood cells in it.  It does not appear overtly infected.  Plan culture of urine. Discussed results with patient and sister.  We will give outpatient referral for evaluation for PT and OT at home health.  Amount and/or Complexity of Data Reviewed Labs: ordered. Radiology: ordered.   Patient evaluated here for generalized weakness.         Final Clinical Impression(s) / ED Diagnoses Final diagnoses:  Weakness    Rx / DC Orders ED Discharge Orders          [place]        12/26/21 1526    Face-to-face encounter (required for Medicare/Medicaid patients)       Comments: I Pattricia Boss certify that this patient is under my care and that I, or a nurse practitioner or physician's assistant working with me, had a face-to-face encounter that meets the physician face-to-face encounter requirements with this patient on 12/26/2021. The encounter with the patient was in whole, or in part for the following medical condition(s) which is the primary reason for home health care (List medical condition): decreased ability to ambulate   12/26/21 1526              Pattricia Boss, MD 12/26/21 1526

## 2021-12-26 NOTE — ED Notes (Signed)
Patient ambulated in room using rolling walker, some assistance needed.  ?

## 2021-12-26 NOTE — ED Notes (Signed)
Lab to add urine culture to current sample. ?

## 2021-12-30 LAB — URINE CULTURE: Culture: 40000 — AB

## 2021-12-31 ENCOUNTER — Telehealth (HOSPITAL_BASED_OUTPATIENT_CLINIC_OR_DEPARTMENT_OTHER): Payer: Self-pay | Admitting: *Deleted

## 2021-12-31 NOTE — Telephone Encounter (Signed)
Post ED Visit - Positive Culture Follow-up: Unsuccessful Patient Follow-up ? ?Culture assessed and recommendations reviewed by: ? ?'[x]'$  Lennette Bihari, Pharm.D. ?'[]'$  Heide Guile, Pharm.D., BCPS AQ-ID ?'[]'$  Parks Neptune, Pharm.D., BCPS ?'[]'$  Alycia Rossetti, Pharm.D., BCPS ?'[]'$  [employer], Pharm.D., BCPS, AAHIVP ?'[]'$  Legrand Como, Pharm.D., BCPS, AAHIVP ?'[]'$  Wynell Balloon, PharmD ?'[]'$  Vincenza Hews, PharmD, BCPS ? ?Positive urine culture ? ?'[x]'$  Patient discharged without antimicrobial prescription and treatment is now indicated if symtomatic ?'[]'$  Organism is resistant to prescribed ED discharge antimicrobial ?'[]'$  Patient with positive blood cultures ? ? ?Unable to contact patient after all  attempts, letter will be sent to address on file ? ?Rosie Fate ?12/31/2021, 11:04 AM  ?

## 2021-12-31 NOTE — Progress Notes (Signed)
ED Antimicrobial Stewardship Positive Culture Follow Up  ? ?Rebecca [name] is an 70 y.o. female who presented to [facility] on 12/26/2021 with a chief complaint of  ?Chief Complaint  ?Patient presents with  ? Weakness  ? ? ?Recent Results (from the past 720 hour(s))  ?Urine Culture     Status: Abnormal  ? Collection Time: 12/26/21  1:31 PM  ? Specimen: Urine, Clean Catch  ?Result Value Ref Range Status  ? Specimen Description   Final  ?  URINE, CLEAN CATCH ?Performed at [facility], [address] [place], [place] [address] ?  ? Special Requests   Final  ?  NONE ?Performed at [facility], [address], [place], [place] [address] ?  ? Culture 40,000 COLONIES/mL ESCHERICHIA COLI (A)  Final  ? Report Status 12/30/2021 FINAL  Final  ? Organism ID, Bacteria ESCHERICHIA COLI (A)  Final  ?    Susceptibility  ? Escherichia coli - MIC*  ?  AMPICILLIN >=32 RESISTANT Resistant   ?  CEFAZOLIN 8 SENSITIVE Sensitive   ?  CEFEPIME <=0.12 SENSITIVE Sensitive   ?  CEFTRIAXONE <=0.25 SENSITIVE Sensitive   ?  CIPROFLOXACIN <=0.25 SENSITIVE Sensitive   ?  GENTAMICIN <=1 SENSITIVE Sensitive   ?  IMIPENEM <=0.25 SENSITIVE Sensitive   ?  NITROFURANTOIN 32 SENSITIVE Sensitive   ?  TRIMETH/SULFA <=20 SENSITIVE Sensitive   ?  AMPICILLIN/SULBACTAM >=32 RESISTANT Resistant   ?  PIP/TAZO <=4 SENSITIVE Sensitive   ?  * 40,000 COLONIES/mL ESCHERICHIA COLI  ? ? ?Plan: Call patient to assess for urinary symptoms (urgency, frequency, dysuria). If no symptoms present no further treatment is needed for asymptomatic bacteruria. If symptoms present start cefdinir '300mg'$  by mouth twice daily x 5 days (EDP aware of PCN allergy listed of increased HR).  ? ?ED Provider: Sherwood Gambler, MD ? ? ?Cristela Felt, PharmD, BCPS ?Clinical Pharmacist ?12/31/2021 10:10 AM ? ?Monday - Friday phone -  [phone] ?Saturday - Sunday phone - [phone] ? ?

## 2022-01-03 DIAGNOSIS — H47233 Glaucomatous optic atrophy, bilateral: Secondary | ICD-10-CM | POA: Diagnosis not present

## 2022-01-03 DIAGNOSIS — H401133 Primary open-angle glaucoma, bilateral, severe stage: Secondary | ICD-10-CM | POA: Diagnosis not present

## 2022-01-08 DIAGNOSIS — N3941 Urge incontinence: Secondary | ICD-10-CM | POA: Diagnosis not present

## 2022-01-08 DIAGNOSIS — R8271 Bacteriuria: Secondary | ICD-10-CM | POA: Diagnosis not present

## 2022-01-16 DIAGNOSIS — G3184 Mild cognitive impairment, so stated: Secondary | ICD-10-CM | POA: Diagnosis not present

## 2022-01-16 DIAGNOSIS — E46 Unspecified protein-calorie malnutrition: Secondary | ICD-10-CM | POA: Diagnosis not present

## 2022-01-16 DIAGNOSIS — Z139 Encounter for screening, unspecified: Secondary | ICD-10-CM | POA: Diagnosis not present

## 2022-01-16 DIAGNOSIS — G319 Degenerative disease of nervous system, unspecified: Secondary | ICD-10-CM | POA: Diagnosis not present

## 2022-01-16 DIAGNOSIS — M25561 Pain in right knee: Secondary | ICD-10-CM | POA: Diagnosis not present

## 2022-01-16 DIAGNOSIS — N390 Urinary tract infection, site not specified: Secondary | ICD-10-CM | POA: Diagnosis not present

## 2022-01-16 DIAGNOSIS — R52 Pain, unspecified: Secondary | ICD-10-CM | POA: Diagnosis not present

## 2022-01-16 DIAGNOSIS — Z681 Body mass index (BMI) 19 or less, adult: Secondary | ICD-10-CM | POA: Diagnosis not present

## 2022-01-16 DIAGNOSIS — R2689 Other abnormalities of gait and mobility: Secondary | ICD-10-CM | POA: Diagnosis not present

## 2022-01-16 DIAGNOSIS — Z9181 History of falling: Secondary | ICD-10-CM | POA: Diagnosis not present

## 2022-01-21 DIAGNOSIS — E46 Unspecified protein-calorie malnutrition: Secondary | ICD-10-CM | POA: Diagnosis not present

## 2022-01-21 DIAGNOSIS — Z87440 Personal history of urinary (tract) infections: Secondary | ICD-10-CM | POA: Diagnosis not present

## 2022-01-21 DIAGNOSIS — R69 Illness, unspecified: Secondary | ICD-10-CM | POA: Diagnosis not present

## 2022-01-21 DIAGNOSIS — Z9181 History of falling: Secondary | ICD-10-CM | POA: Diagnosis not present

## 2022-01-21 DIAGNOSIS — E0590 Thyrotoxicosis, unspecified without thyrotoxic crisis or storm: Secondary | ICD-10-CM | POA: Diagnosis not present

## 2022-01-21 DIAGNOSIS — G3184 Mild cognitive impairment, so stated: Secondary | ICD-10-CM | POA: Diagnosis not present

## 2022-01-21 DIAGNOSIS — E559 Vitamin D deficiency, unspecified: Secondary | ICD-10-CM | POA: Diagnosis not present

## 2022-01-21 DIAGNOSIS — G8928 Other chronic postprocedural pain: Secondary | ICD-10-CM | POA: Diagnosis not present

## 2022-01-21 DIAGNOSIS — H409 Unspecified glaucoma: Secondary | ICD-10-CM | POA: Diagnosis not present

## 2022-01-21 DIAGNOSIS — M25561 Pain in right knee: Secondary | ICD-10-CM | POA: Diagnosis not present

## 2022-01-21 DIAGNOSIS — M810 Age-related osteoporosis without current pathological fracture: Secondary | ICD-10-CM | POA: Diagnosis not present

## 2022-01-21 DIAGNOSIS — E7800 Pure hypercholesterolemia, unspecified: Secondary | ICD-10-CM | POA: Diagnosis not present

## 2022-01-28 DIAGNOSIS — R350 Frequency of micturition: Secondary | ICD-10-CM | POA: Diagnosis not present

## 2022-01-30 DIAGNOSIS — R413 Other amnesia: Secondary | ICD-10-CM | POA: Diagnosis not present

## 2022-01-30 DIAGNOSIS — R350 Frequency of micturition: Secondary | ICD-10-CM | POA: Diagnosis not present

## 2022-01-30 DIAGNOSIS — R32 Unspecified urinary incontinence: Secondary | ICD-10-CM | POA: Diagnosis not present

## 2022-02-04 DIAGNOSIS — R440 Auditory hallucinations: Secondary | ICD-10-CM | POA: Diagnosis not present

## 2022-02-04 DIAGNOSIS — R350 Frequency of micturition: Secondary | ICD-10-CM | POA: Diagnosis not present

## 2022-02-04 DIAGNOSIS — E0590 Thyrotoxicosis, unspecified without thyrotoxic crisis or storm: Secondary | ICD-10-CM | POA: Diagnosis not present

## 2022-02-04 DIAGNOSIS — R413 Other amnesia: Secondary | ICD-10-CM | POA: Diagnosis not present

## 2022-02-04 DIAGNOSIS — E559 Vitamin D deficiency, unspecified: Secondary | ICD-10-CM | POA: Diagnosis not present

## 2022-02-05 ENCOUNTER — Other Ambulatory Visit: Payer: Self-pay | Admitting: Family Medicine

## 2022-02-05 DIAGNOSIS — R413 Other amnesia: Secondary | ICD-10-CM

## 2022-02-19 ENCOUNTER — Inpatient Hospital Stay: Admission: RE | Admit: 2022-02-19 | Payer: Medicare HMO | Source: Ambulatory Visit

## 2022-02-26 ENCOUNTER — Other Ambulatory Visit: Payer: Self-pay | Admitting: Family Medicine

## 2022-02-26 ENCOUNTER — Ambulatory Visit
Admission: RE | Admit: 2022-02-26 | Discharge: 2022-02-26 | Disposition: A | Discharge status: Home / Self Care | Payer: Medicare HMO | Source: Ambulatory Visit | Attending: Family Medicine | Admitting: Family Medicine

## 2022-02-26 ENCOUNTER — Other Ambulatory Visit (HOSPITAL_COMMUNITY): Payer: Self-pay | Admitting: Family Medicine

## 2022-02-26 DIAGNOSIS — R410 Disorientation, unspecified: Secondary | ICD-10-CM | POA: Diagnosis not present

## 2022-02-26 DIAGNOSIS — R413 Other amnesia: Secondary | ICD-10-CM

## 2022-02-26 DIAGNOSIS — E0590 Thyrotoxicosis, unspecified without thyrotoxic crisis or storm: Secondary | ICD-10-CM

## 2022-02-26 MED ORDER — GADOBENATE DIMEGLUMINE 529 MG/ML IV SOLN
10.0000 mL | Freq: Once | INTRAVENOUS | Status: AC | PRN
  Administered 2022-02-26: 10 mL via INTRAVENOUS

## 2022-03-07 ENCOUNTER — Encounter: Payer: Self-pay | Admitting: *Deleted
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
## 2022-03-12 ENCOUNTER — Encounter: Payer: Self-pay | Admitting: Psychiatry

## 2022-03-12 ENCOUNTER — Ambulatory Visit: Payer: Medicare HMO | Admitting: Psychiatry

## 2022-03-12 VITALS — BP 107/69 | HR 72 | Ht 66.0 in | Wt 114.0 lb

## 2022-03-12 DIAGNOSIS — R413 Other amnesia: Secondary | ICD-10-CM

## 2022-03-12 MED ORDER — DONEPEZIL HCL 5 MG PO TABS: ORAL_TABLET | ORAL | 3 refills | Status: AC

## 2022-03-12 NOTE — Patient Instructions (Addendum)
Donepezil ?We recommended that you take Donepezil (Aricept) 5mg tab once a day for the first 4 weeks, then increase the dose to 10mg once per day. It is better to take Donepezil with breakfast or lunch. Aricept is well tolerated, although some people may experience nausea, diarrhea, not sleeping well, vomiting, muscle cramps, feeling tired, or not wanting to eat. These side effects were usually mild and temporary. If symptoms continue or become severe, you should stop the medication and contact us.         ? ? ?Tasks to improve attention/working memory ?1. Good sleep hygiene (7-8 hrs of sleep) ?2. Learning a new skill (Painting, Carpentry, Pottery, new language, Knitting). ?3.Cognitive exercises (keep a daily journal, Puzzles) ?4. Physical exercise and training  (30 min/day X 4 days week) ?5. Being on Antidepressant if needed ?6.Yoga, Meditation, Tai Chi ?7. Decrease alcohol intake ?8.Have a clear schedule and structure in daily routine ?9. Wear hearing aids as often as possible ? ?

## 2022-03-13 DIAGNOSIS — R350 Frequency of micturition: Secondary | ICD-10-CM | POA: Diagnosis not present

## 2022-03-13 DIAGNOSIS — N3941 Urge incontinence: Secondary | ICD-10-CM | POA: Diagnosis not present

## 2022-03-14 ENCOUNTER — Ambulatory Visit (HOSPITAL_COMMUNITY)
Admission: RE | Admit: 2022-03-14 | Discharge: 2022-03-14 | Disposition: A | Discharge status: Home / Self Care | Payer: Medicare HMO | Source: Ambulatory Visit | Attending: Family Medicine | Admitting: Family Medicine

## 2022-03-14 ENCOUNTER — Encounter (HOSPITAL_COMMUNITY)
Admission: RE | Admit: 2022-03-14 | Discharge: 2022-03-14 | Disposition: A | Discharge status: Home / Self Care | Payer: Medicare HMO | Source: Ambulatory Visit | Attending: Family Medicine | Admitting: Family Medicine

## 2022-03-14 DIAGNOSIS — E0590 Thyrotoxicosis, unspecified without thyrotoxic crisis or storm: Secondary | ICD-10-CM | POA: Insufficient documentation

## 2022-03-14 MED ORDER — SODIUM IODIDE I-123 7.4 MBQ CAPS
456.0000 | ORAL_CAPSULE | Freq: Once | ORAL | Status: AC
  Administered 2022-03-14: 456 via ORAL

## 2022-03-15 ENCOUNTER — Ambulatory Visit (HOSPITAL_COMMUNITY)
Admission: RE | Admit: 2022-03-15 | Discharge: 2022-03-15 | Disposition: A | Discharge status: Home / Self Care | Payer: Medicare HMO | Source: Ambulatory Visit | Attending: Family Medicine | Admitting: Family Medicine

## 2022-03-15 DIAGNOSIS — R630 Anorexia: Secondary | ICD-10-CM | POA: Diagnosis not present

## 2022-03-15 DIAGNOSIS — R634 Abnormal weight loss: Secondary | ICD-10-CM | POA: Diagnosis not present

## 2022-03-18 ENCOUNTER — Telehealth: Payer: Self-pay | Admitting: Psychiatry

## 2022-03-18 NOTE — Telephone Encounter (Signed)
Sounds good, Aricept usually doesn't cause runny nose or cough so symptoms are probably unrelated to the medication

## 2022-03-18 NOTE — Telephone Encounter (Signed)
Pt's sister states about 2 days after pt started donepezil (ARICEPT) 5 MG tablet she began having: more confusion, runny nose, headaches, tremors, nasal drainage, and cough.  Sister wants to know if this will eventually go away or not, please call.

## 2022-03-18 NOTE — Telephone Encounter (Signed)
I called the pt's sister back and we discussed message. She sts two days after beginning Aricept pt started showing s/x of runny nose, cough, shivering, headaches, and increased confusion at night. I discussed sx with sister and advised it sounds like she may have allergy/sinus related issue. She sts a home health nurse is coming to the house tomorrow for her weekly visit.  I advised to wait until tomorrow and see what the home health nurse says since sx are closely related to allergy/sinus issues. She was agreeable to this plan.  I did advise she is on the lowest dosage for the aricept and if she wanted to hold for 2-3 days to see if sx improved she could. She will call back tomorrow after nurse visit and update [place].

## 2022-03-21 NOTE — Telephone Encounter (Signed)
Called Rebecca [name] and advised her Dr Billey Gosling advises to stop Aricept today, let symptoms resolve and patient get back to her baseline. She can call back for any new recommendations. Rebecca [name] verbalized understanding, appreciation.

## 2022-03-21 NOTE — Telephone Encounter (Signed)
Pt's sister has called back to report pt is lethargic and is having hallucinations from the Aricept. Pt's sister states the side effects she mentioned on Monday 05-22 were from another medication.

## 2022-03-21 NOTE — Telephone Encounter (Signed)
Yes please have her stop the aricept, thanks

## 2022-04-01 ENCOUNTER — Other Ambulatory Visit: Payer: Self-pay | Admitting: Psychiatry

## 2022-04-01 MED ORDER — MEMANTINE HCL 10 MG PO TABS: 10.0000 mg | ORAL_TABLET | Freq: Two times a day (BID) | ORAL | 6 refills | Status: AC

## 2022-04-01 MED ORDER — MEMANTINE HCL 28 X 5 MG & 21 X 10 MG PO TABS: ORAL_TABLET | ORAL | 0 refills | Status: DC

## 2022-04-01 NOTE — Telephone Encounter (Signed)
I sent in a prescription for Namenda to her pharmacy. This should help with her memory and confusion. She should let me know if symptoms persist or get worse while on this medication

## 2022-04-01 NOTE — Telephone Encounter (Signed)
Pt's sister is asking for a call to discuss something being called in to help pt.  Sister states pt is confused about what is going on with her, scared,having hallucinations. Pt's sister states she was told pt needed to get to her base line which she is now but still needs something to calm her, please call.

## 2022-04-01 NOTE — Telephone Encounter (Signed)
Called sister Pamala Hurry, advised her of Dr Georgina Peer new Rx and instructions. she verbalized understanding, appreciation.

## 2022-04-03 ENCOUNTER — Other Ambulatory Visit: Payer: Self-pay | Admitting: Psychiatry

## 2022-04-03 MED ORDER — MEMANTINE HCL 5 MG PO TABS: ORAL_TABLET | ORAL | 0 refills | Status: AC

## 2022-04-03 NOTE — Telephone Encounter (Signed)
I called the pt's sister and relayed message. She verbalized understanding and agreement to plan.

## 2022-04-03 NOTE — Telephone Encounter (Signed)
Joe pharmacist with [facility] called this morning and wanted to let the provider know the Namenda titration pack is on back order. At this time there is not a release date.   He wanted to know if the doctor wanted to wait until the medication was available or if she wanted to send a separate rx for the 5 mg tablets?

## 2022-04-03 NOTE — Telephone Encounter (Signed)
I sent in a separate rx for the 5 mg pills. She can use that instead of the titration pack, thanks

## 2022-04-12 ENCOUNTER — Other Ambulatory Visit (HOSPITAL_BASED_OUTPATIENT_CLINIC_OR_DEPARTMENT_OTHER): Payer: Self-pay

## 2022-04-12 ENCOUNTER — Emergency Department (HOSPITAL_BASED_OUTPATIENT_CLINIC_OR_DEPARTMENT_OTHER): Payer: Medicare HMO

## 2022-04-12 ENCOUNTER — Encounter (HOSPITAL_BASED_OUTPATIENT_CLINIC_OR_DEPARTMENT_OTHER): Payer: Self-pay

## 2022-04-12 ENCOUNTER — Emergency Department (HOSPITAL_BASED_OUTPATIENT_CLINIC_OR_DEPARTMENT_OTHER)
Admission: EM | Admit: 2022-04-12 | Discharge: 2022-04-12 | Disposition: A | Discharge status: Home / Self Care | Payer: Medicare HMO | Attending: Emergency Medicine | Admitting: Emergency Medicine

## 2022-04-12 ENCOUNTER — Other Ambulatory Visit: Payer: Self-pay

## 2022-04-12 DIAGNOSIS — R69 Illness, unspecified: Secondary | ICD-10-CM | POA: Diagnosis not present

## 2022-04-12 DIAGNOSIS — E871 Hypo-osmolality and hyponatremia: Secondary | ICD-10-CM | POA: Insufficient documentation

## 2022-04-12 DIAGNOSIS — N390 Urinary tract infection, site not specified: Secondary | ICD-10-CM | POA: Diagnosis not present

## 2022-04-12 DIAGNOSIS — W19XXXA Unspecified fall, initial encounter: Secondary | ICD-10-CM | POA: Insufficient documentation

## 2022-04-12 DIAGNOSIS — F0390 Unspecified dementia without behavioral disturbance: Secondary | ICD-10-CM

## 2022-04-12 DIAGNOSIS — R4182 Altered mental status, unspecified: Secondary | ICD-10-CM | POA: Diagnosis not present

## 2022-04-12 DIAGNOSIS — R531 Weakness: Secondary | ICD-10-CM | POA: Diagnosis not present

## 2022-04-12 DIAGNOSIS — R630 Anorexia: Secondary | ICD-10-CM | POA: Diagnosis not present

## 2022-04-12 LAB — URINALYSIS, ROUTINE W REFLEX MICROSCOPIC
Bilirubin Urine: NEGATIVE
Glucose, UA: NEGATIVE mg/dL
Ketones, ur: 15 mg/dL — AB
Nitrite: POSITIVE — AB
Protein, ur: 30 mg/dL — AB
Specific Gravity, Urine: 1.02 (ref 1.005–1.030)
WBC, UA: >50 WBC/hpf — ABNORMAL HIGH (ref 0–5)
pH: 5.5 (ref 5.0–8.0)

## 2022-04-12 LAB — CBC WITH DIFFERENTIAL/PLATELET
Abs Immature Granulocytes: 0.01 10*3/uL (ref 0.00–0.07)
Basophils Absolute: 0 10*3/uL (ref 0.0–0.1)
Basophils Relative: 0 %
Eosinophils Absolute: 0.1 10*3/uL (ref 0.0–0.5)
Eosinophils Relative: 1 %
HCT: 42.7 % (ref 36.0–46.0)
Hemoglobin: 14 g/dL (ref 12.0–15.0)
Immature Granulocytes: 0 %
Lymphocytes Relative: 17 %
Lymphs Abs: 1.1 10*3/uL (ref 0.7–4.0)
MCH: 31.3 pg (ref 26.0–34.0)
MCHC: 32.8 g/dL (ref 30.0–36.0)
MCV: 95.3 fL (ref 80.0–100.0)
Monocytes Absolute: 0.5 10*3/uL (ref 0.1–1.0)
Monocytes Relative: 8 %
Neutro Abs: 4.6 10*3/uL (ref 1.7–7.7)
Neutrophils Relative %: 74 %
Platelets: 255 10*3/uL (ref 150–400)
RBC: 4.48 MIL/uL (ref 3.87–5.11)
RDW: 12.5 % (ref 11.5–15.5)
WBC: 6.3 10*3/uL (ref 4.0–10.5)
nRBC: 0 % (ref 0.0–0.2)

## 2022-04-12 LAB — COMPREHENSIVE METABOLIC PANEL
ALT: 15 U/L (ref 0–44)
AST: 18 U/L (ref 15–41)
Albumin: 4.5 g/dL (ref 3.5–5.0)
Alkaline Phosphatase: 45 U/L (ref 38–126)
Anion gap: 8 (ref 5–15)
BUN: 17 mg/dL (ref 8–23)
CO2: 27 mmol/L (ref 22–32)
Calcium: 10.2 mg/dL (ref 8.9–10.3)
Chloride: 97 mmol/L — ABNORMAL LOW (ref 98–111)
Creatinine, Ser: 0.64 mg/dL (ref 0.44–1.00)
GFR, Estimated: >60 mL/min (ref >60)
Glucose, Bld: 101 mg/dL — ABNORMAL HIGH (ref 70–99)
Potassium: 4.2 mmol/L (ref 3.5–5.1)
Sodium: 132 mmol/L — ABNORMAL LOW (ref 135–145)
Total Bilirubin: 0.6 mg/dL (ref 0.3–1.2)
Total Protein: 7.3 g/dL (ref 6.5–8.1)

## 2022-04-12 MED ORDER — CEPHALEXIN 500 MG PO CAPS
500.0000 mg | ORAL_CAPSULE | Freq: Four times a day (QID) | ORAL | 0 refills | Status: AC
  Filled 2022-04-12: qty 20, 5d supply, fill #0

## 2022-04-12 MED ORDER — SODIUM CHLORIDE 0.9 % IV SOLN
1.0000 g | Freq: Once | INTRAVENOUS | Status: AC
  Administered 2022-04-12: 1 g via INTRAVENOUS
  Filled 2022-04-12: qty 10

## 2022-04-12 NOTE — ED Triage Notes (Signed)
Patient here POV from Home with Sister.  Family endorses Patient has had a slow decrease in PO Intake for the Past Few Days. Not associated with any Discernable Symptoms such as Pain, Fevers, Diarrhea.  NAD Noted during Triage. A&Ox4. GCS 15. BIB Wheelchair.

## 2022-04-12 NOTE — Discharge Instructions (Addendum)
Rebecca [name]'s work-up revealed that she has a urinary tract infection Other labs within normal limits and imaging of her head did not show any evidence of acute abnormality. She is given antibiotics here in the emergency department and is given a prescription for Keflex. Please return if she is having any new problems or appears worse, otherwise follow-up with her primary care physician next week

## 2022-04-12 NOTE — ED Provider Notes (Signed)
[place] EMERGENCY DEPT Provider Note   CSN: [id number] Arrival date & time: 04/12/22  1124     History  Chief Complaint  Patient presents with   Loss of Appetite    Rebecca [name] is a 71 y.o. female.  HPI 71 year old female with dementia presents today with sister who is primary caregiver with reports that she is not acting like her usual self.  The sister went out of town over the past week and a friend stayed with her.  She had 1 episode of the fall.  The sister states that since she has been back in town her sister is just not her usual self.  She does not have any focal neurological deficits, lateralized weakness, fever, or vomiting but has not been eating as well as usual.  Her sister thinks that this may be secondary to urinary tract infection and also wants her evaluated for the fall.     Home Medications Prior to Admission medications   Medication Sig Start Date End Date Taking? Authorizing Provider  cephALEXin (KEFLEX) 500 MG capsule Take 1 capsule (500 mg total) by mouth 4 (four) times daily. 04/12/22  Yes Pattricia Boss, MD  acetaminophen (TYLENOL) 325 MG tablet Take 325 mg by mouth every 6 (six) hours as needed for pain.    [provider]  Calcium Carbonate-Vit D-Min (CALTRATE 600+D PLUS MINIS PO) Take 1 tablet by mouth daily.    [provider]  donepezil (ARICEPT) 5 MG tablet Take 1 pill daily for 4 weeks, then increase to 2 pills daily 03/12/22   Genia Harold, MD  Latanoprostene Bunod (VYZULTA) 0.024 % SOLN 1 drops AS DIRECTED (route: ophthalmic (eye)) 01/21/22   [provider]  memantine (NAMENDA) 10 MG tablet Take 1 tablet (10 mg total) by mouth 2 (two) times daily. Take after completing titration pack 05/01/22   Genia Harold, MD  memantine (NAMENDA) 5 MG tablet Take 1 tablet ('5mg'$ ) in the morning for one week. Then take 1 tablet ('5mg'$ ) twice a day for one week. Then take 2 tablets ('10mg'$ ) in the morning and 1 tablet ('5mg'$ )  in the evening for one week. Then switch to 10 mg tablets (10 mg twice a day) 04/03/22   Genia Harold, MD  Netarsudil Dimesylate (RHOPRESSA) 0.02 % SOLN 1 drops AS DIRECTED (route: ophthalmic (eye)) 01/21/22   [provider]  timolol (TIMOPTIC) 0.5 % ophthalmic solution  07/30/14   [provider]      Allergies    Myrbetriq [mirabegron er], Aricept [donepezil], Glucosamine, Ibandronic acid, Nitrofurantoin, Penicillins, and Sulfa antibiotics    Review of Systems   Review of Systems  Physical Exam Updated Vital Signs BP 139/83   Pulse 72   Temp 98.5 F (36.9 C) (Temporal)   Resp 16   Ht 1.676 m ('5\' 6"'$ )   Wt 51.7 kg   LMP 10/28/2004 (Approximate)   SpO2 100%   BMI 18.40 kg/m  Physical Exam Vitals and nursing note reviewed.  Constitutional:      General: She is not in acute distress.    Appearance: Normal appearance.  HENT:     Head: Normocephalic and atraumatic.     Right Ear: External ear normal.     Left Ear: External ear normal.     Nose: Nose normal.     Mouth/Throat:     Mouth: Mucous membranes are moist.     Pharynx: Oropharynx is clear.  Eyes:     Pupils: Pupils are equal, round,  and reactive to light.  Cardiovascular:     Rate and Rhythm: Normal rate and regular rhythm.     Pulses: Normal pulses.  Pulmonary:     Effort: Pulmonary effort is normal.     Breath sounds: Normal breath sounds.  Abdominal:     General: Abdomen is flat. Bowel sounds are normal.     Palpations: Abdomen is soft.  Musculoskeletal:        General: Normal range of motion.     Cervical back: Normal range of motion.     Comments: Back and hips visualized with no obvious external signs of trauma and no point tenderness noted on palpation  Skin:    General: Skin is warm and dry.  Neurological:     General: No focal deficit present.     Mental Status: She is alert.  Psychiatric:        Mood and Affect: Mood normal.     ED Results / Procedures / Treatments    Labs (all labs ordered are listed, but only abnormal results are displayed) Labs Reviewed  COMPREHENSIVE METABOLIC PANEL - Abnormal; Notable for the following components:      Result Value   Sodium 132 (*)    Chloride 97 (*)    Glucose, Bld 101 (*)    All other components within normal limits  URINALYSIS, ROUTINE W REFLEX MICROSCOPIC - Abnormal; Notable for the following components:   APPearance HAZY (*)    Hgb urine dipstick LARGE (*)    Ketones, ur 15 (*)    Protein, ur 30 (*)    Nitrite POSITIVE (*)    Leukocytes,Ua LARGE (*)    WBC, UA >50 (*)    Bacteria, UA MANY (*)    All other components within normal limits  URINE CULTURE  CBC WITH DIFFERENTIAL/PLATELET    EKG None  Radiology CT Head Wo Contrast  Result Date: 04/12/2022 CLINICAL DATA:  Mental status change, unknown cause EXAM: CT HEAD WITHOUT CONTRAST TECHNIQUE: Contiguous axial images were obtained from the base of the skull through the vertex without intravenous contrast. RADIATION DOSE REDUCTION: This exam was performed according to the departmental dose-optimization program which includes automated exposure control, adjustment of the mA and/or kV according to patient size and/or use of iterative reconstruction technique. COMPARISON:  12/26/2021 FINDINGS: Brain: There is no acute intracranial hemorrhage, mass effect, or edema. Gray-white differentiation is preserved. There is no extra-axial fluid collection. Prominence of the ventricles and sulci reflects stable parenchymal volume loss. Patchy hypoattenuation in the supratentorial white matter is nonspecific but probably reflects stable chronic microvascular ischemic changes. Vascular: There is mild atherosclerotic calcification at the skull base. Skull: Calvarium is unremarkable. Sinuses/Orbits: No acute finding. Other: None. IMPRESSION: No acute intracranial abnormality. Electronically Signed   By: Macy Mis M.D.   On: 04/12/2022 12:41   DG Chest Port 1  View  Result Date: 04/12/2022 CLINICAL DATA:  weakness and loss of appetite EXAM: PORTABLE CHEST 1 VIEW COMPARISON:  December 26, 2021 FINDINGS: The heart size and mediastinal contours are within normal limits. Both lungs are hyperinflated and clear. The visualized skeletal structures are unremarkable. IMPRESSION: No active disease. Electronically Signed   By: Frazier Richards M.D.   On: 04/12/2022 12:36    Procedures Procedures    Medications Ordered in ED Medications  cefTRIAXone (ROCEPHIN) 1 g in sodium chloride 0.9 % 100 mL IVPB (has no administration in time range)    ED Course/ Medical Decision Making/ A&P Clinical Course as  of 04/12/22 1346  Fri Apr 12, 2022  1340 ET reviewed interpreted no evidence of acute abnormality and radiologist interpretation concurs [DR]  1340 Chest x-Alante Tolan reviewed interpreted no evidence of acute abnormality and radiologist interpretation concurs [DR]  1340 CBC reviewed interpreted normal [DR]  1610 Complete metabolic panel reviewed interpreted and mild hyponatremia with sodium of 132 [DR]  1341 Urinalysis reviewed interpreted and greater than 50 white blood cells with many bacteria consistent with urinary tract infection will culture and give Rocephin [DR]    Clinical Course User Index [DR] Pattricia Boss, MD                           Medical Decision Making 71 year old female history of dementia presents with reports of some change in her baseline behavior.  She does not appear to be acutely ill with normal vital signs.  She is evaluated here with labs and imaging due to recent fall.  Imaging does not reveal any signs of acute injury or intracranial abnormality.  However, urinalysis is consistent with urinary tract infection. Patient is to be given Rocephin here in the department.  I discussed the results with her sister who is her primary caregiver. She will be discharged home on Keflex. Urine to be cultured Return precautions given  Amount and/or  Complexity of Data Reviewed Labs: ordered. Decision-making details documented in ED Course. Radiology: ordered and independent interpretation performed. Decision-making details documented in ED Course.           Final Clinical Impression(s) / ED Diagnoses Final diagnoses:  Urinary tract infection without hematuria, site unspecified  Fall, initial encounter  Dementia without behavioral disturbance, psychotic disturbance, mood disturbance, or anxiety, unspecified dementia severity, unspecified dementia type ([place])    Rx / DC Orders ED Discharge Orders          Ordered    cephALEXin (KEFLEX) 500 MG capsule  4 times daily        04/12/22 1345              Pattricia Boss, MD 04/12/22 1346

## 2022-04-12 NOTE — ED Notes (Signed)
Patient and family verbalizes understanding of discharge instructions. Opportunity for questioning and answers were provided. Patient discharged from ED.

## 2022-04-15 LAB — URINE CULTURE: Culture: >=100000 — AB

## 2022-04-16 ENCOUNTER — Telehealth: Payer: Self-pay | Admitting: Psychiatry

## 2022-04-16 ENCOUNTER — Telehealth: Payer: Self-pay

## 2022-04-16 DIAGNOSIS — H401133 Primary open-angle glaucoma, bilateral, severe stage: Secondary | ICD-10-CM | POA: Diagnosis not present

## 2022-04-16 DIAGNOSIS — H47233 Glaucomatous optic atrophy, bilateral: Secondary | ICD-10-CM | POA: Diagnosis not present

## 2022-04-16 NOTE — Telephone Encounter (Signed)
Pt's sister, Candie Mile (on DPRO) memantine (NAMENDA) 10 MG tablet effecting movement and mobility; shuffling feet with use of walker. Would like a call from nurse.

## 2022-04-16 NOTE — Telephone Encounter (Signed)
Post ED Visit - Positive Culture Follow-up  Culture report reviewed by antimicrobial stewardship pharmacist: [place] Team '[x]'$  Mal Misty Dohlen, [place].D. '[]'$  Heide Guile, Pharm.D., BCPS AQ-ID '[]'$  Parks Neptune, Pharm.D., BCPS '[]'$  Alycia Rossetti, Pharm.D., BCPS '[]'$  [place], Pharm.D., BCPS, AAHIVP '[]'$  Legrand Como, Pharm.D., BCPS, AAHIVP '[]'$  Salome Arnt, PharmD, BCPS '[]'$  Johnnette Gourd, PharmD, BCPS '[]'$  Hughes Better, PharmD, BCPS '[]'$  Leeroy Cha, PharmD '[]'$  Laqueta Linden, PharmD, BCPS '[]'$  Albertina Parr, PharmD  [place] Team '[]'$  Leodis Sias, PharmD '[]'$  Lindell Spar, PharmD '[]'$  Royetta Asal, PharmD '[]'$  Graylin Shiver, Rph '[]'$  Rema Fendt) Glennon Mac, PharmD '[]'$  Arlyn Dunning, PharmD '[]'$  Netta Cedars, PharmD '[]'$  Dia Sitter, PharmD '[]'$  Leone Haven, PharmD '[]'$  Gretta Arab, PharmD '[]'$  Theodis Shove, PharmD '[]'$  Peggyann Juba, PharmD '[]'$  Reuel Boom, PharmD   Positive urine culture Treated with Cephalexin, organism sensitive to the same and no further patient follow-up is required at this time.  Glennon Hamilton 04/16/2022, 10:32 AM

## 2022-04-16 NOTE — Telephone Encounter (Signed)
Contacted pt sister back, pt did not start medication until Saturday 6/17. She stated she has improved as far as mood goes but pt also Started Cefalexin '500mg'$   four times a day Friday for UTI, home health nurse advised her to decrease it yesterday to BID. Advised her the Namenda Is a low dose and more than likely isn't causing her problems as well as her having sever fatigue. Any other suggestions ?

## 2022-04-16 NOTE — Telephone Encounter (Signed)
Is this a known side affect with namenda ?

## 2022-04-16 NOTE — Telephone Encounter (Signed)
Pt returned call. Pt verbalized understanding and was very appreciative.

## 2022-04-16 NOTE — Telephone Encounter (Signed)
I agree. I'd keep the dose the same for now and have her reach out if symptoms continue after completing her UTI treatment

## 2022-04-16 NOTE — Telephone Encounter (Signed)
PHONE RM CAN RELAY Contacted pt sister back, LVM rq call back.   Dr Billey Gosling agreed with my recommendations, we will keep the dose ('5mg'$  namenda) the same for now and have her reach out if symptoms continue after completing her UTI treatment.

## 2022-04-16 NOTE — Telephone Encounter (Signed)
This isn't a known side effect, but it can cause dizziness which might affect her walking. Did this start after she increased the dose?

## 2022-04-19 DIAGNOSIS — I728 Aneurysm of other specified arteries: Secondary | ICD-10-CM | POA: Diagnosis not present

## 2022-04-19 DIAGNOSIS — Z79899 Other long term (current) drug therapy: Secondary | ICD-10-CM | POA: Diagnosis not present

## 2022-04-19 DIAGNOSIS — E0590 Thyrotoxicosis, unspecified without thyrotoxic crisis or storm: Secondary | ICD-10-CM | POA: Diagnosis not present

## 2022-04-19 DIAGNOSIS — F0390 Unspecified dementia without behavioral disturbance: Secondary | ICD-10-CM | POA: Diagnosis not present

## 2022-04-19 DIAGNOSIS — E46 Unspecified protein-calorie malnutrition: Secondary | ICD-10-CM | POA: Diagnosis not present

## 2022-04-19 DIAGNOSIS — E7800 Pure hypercholesterolemia, unspecified: Secondary | ICD-10-CM | POA: Diagnosis not present

## 2022-04-19 DIAGNOSIS — E559 Vitamin D deficiency, unspecified: Secondary | ICD-10-CM | POA: Diagnosis not present

## 2022-04-19 DIAGNOSIS — R69 Illness, unspecified: Secondary | ICD-10-CM | POA: Diagnosis not present

## 2022-04-19 DIAGNOSIS — R1310 Dysphagia, unspecified: Secondary | ICD-10-CM | POA: Diagnosis not present

## 2022-04-19 DIAGNOSIS — R413 Other amnesia: Secondary | ICD-10-CM | POA: Diagnosis not present

## 2022-04-19 DIAGNOSIS — M810 Age-related osteoporosis without current pathological fracture: Secondary | ICD-10-CM | POA: Diagnosis not present

## 2022-04-19 DIAGNOSIS — F418 Other specified anxiety disorders: Secondary | ICD-10-CM | POA: Diagnosis not present

## 2022-04-21 ENCOUNTER — Other Ambulatory Visit: Payer: Self-pay | Admitting: Psychiatry

## 2022-04-24 ENCOUNTER — Other Ambulatory Visit: Payer: Self-pay | Admitting: Family Medicine

## 2022-04-24 DIAGNOSIS — R1310 Dysphagia, unspecified: Secondary | ICD-10-CM

## 2022-05-02 ENCOUNTER — Ambulatory Visit: Payer: Medicare HMO

## 2022-05-13 ENCOUNTER — Telehealth: Payer: Self-pay | Admitting: *Deleted

## 2022-05-13 NOTE — Telephone Encounter (Signed)
Started donepezil PA on CMM< key [id number], received this reply: The patient currently has access to the requested medication and a Prior Authorization is not needed for the patient/medication.

## 2022-06-28 DEATH — deceased

## 2022-07-08 IMAGING — CT CT HEAD W/O CM
4 series · 16 of 47 positions shown, 18 images · non-contrast
Comparison: 12/26/2021

CLINICAL DATA: Mental status change, unknown cause



[Series 2: head wo · axial · 0.41mm/px · z∈[+975,+1085]mm · 7 of 30 slices shown, 9 images]
[im 4/30  brain]
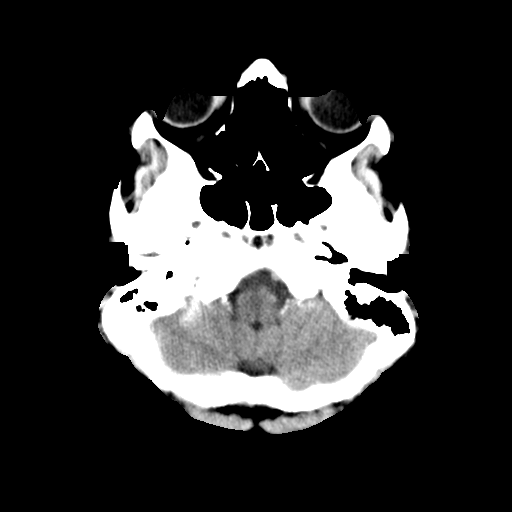
[im 4/30  bone]
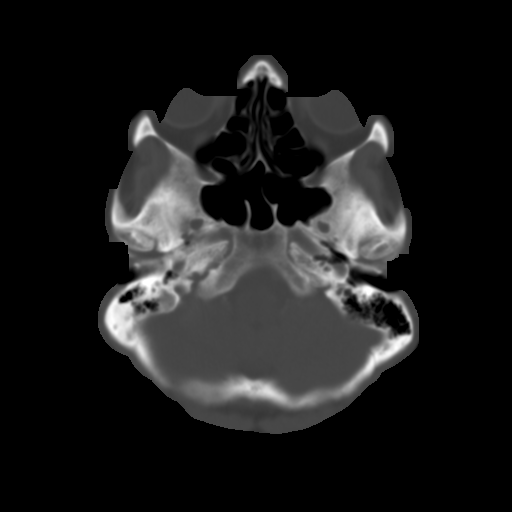
[im 8/30  brain]
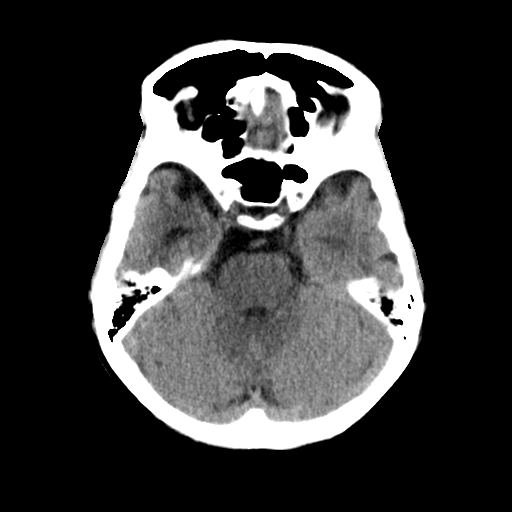
[im 11/30  brain]
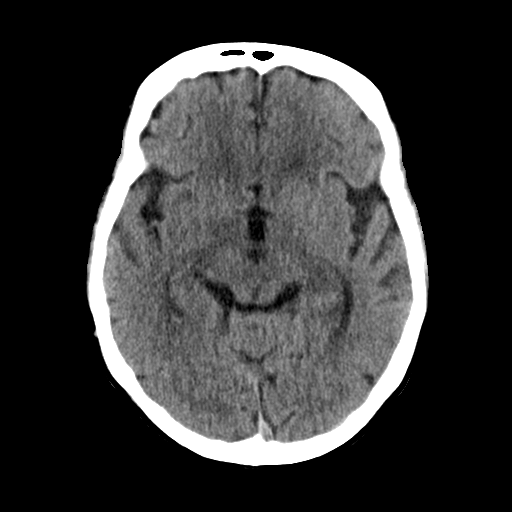
[im 15/30  brain]
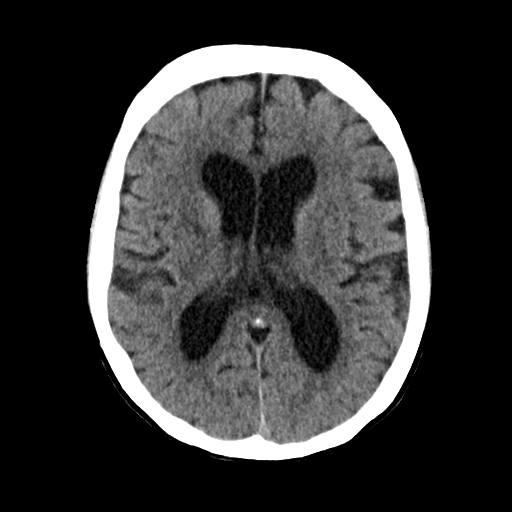
[im 19/30  brain]
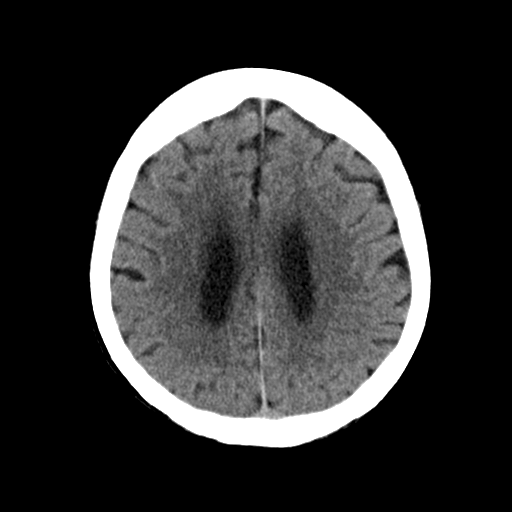
[im 19/30  bone]
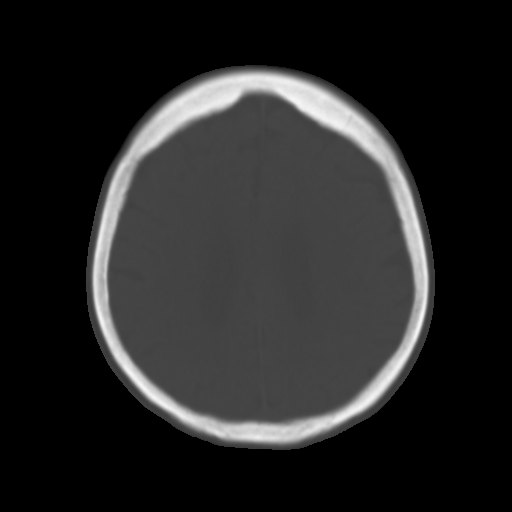
[im 22/30  brain]
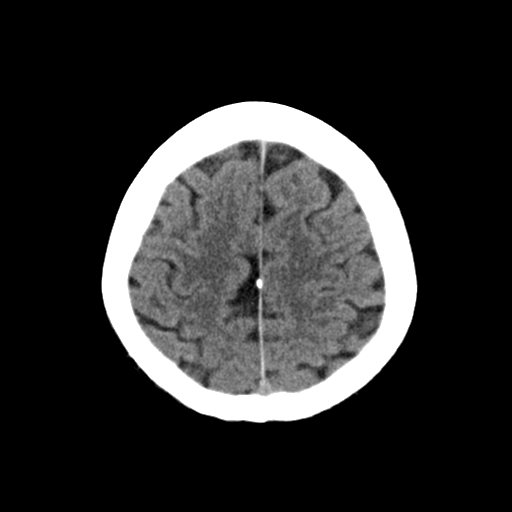
[im 26/30  brain]
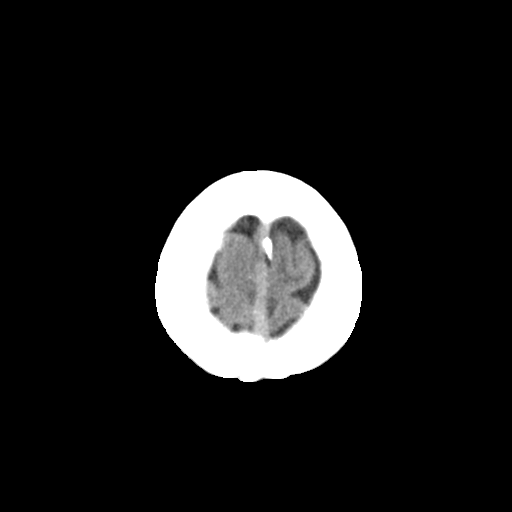

[Series 3: head bone · axial · 0.41mm/px · z∈[+974,+1002]mm · 3 of 73 slices shown]
[im 8/73  bone]
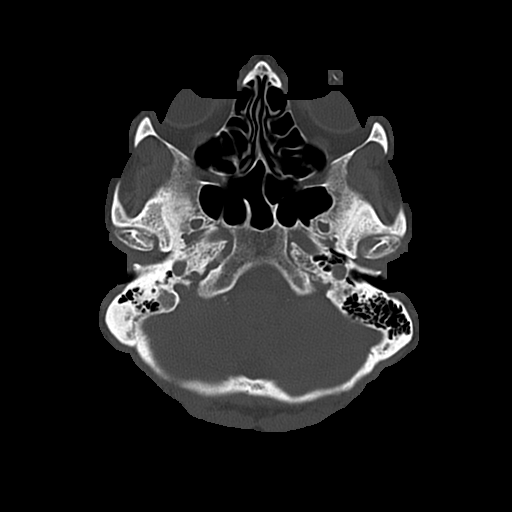
[im 15/73  bone]
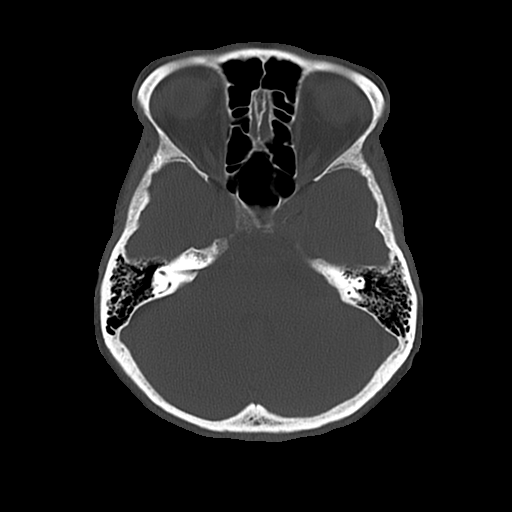
[im 22/73  bone]
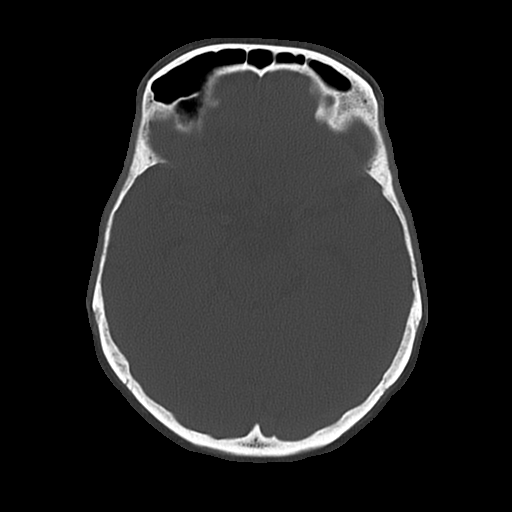

[Series 4: coronal soft · coronal · 0.31mm/px · 3 of 63 slices shown]
[im 21/63  brain]
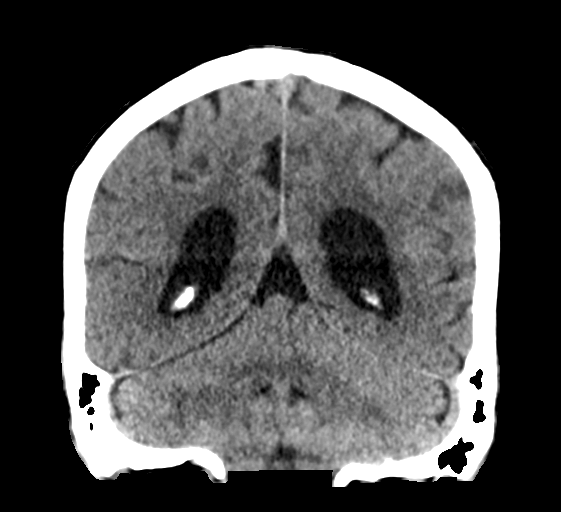
[im 28/63  brain]
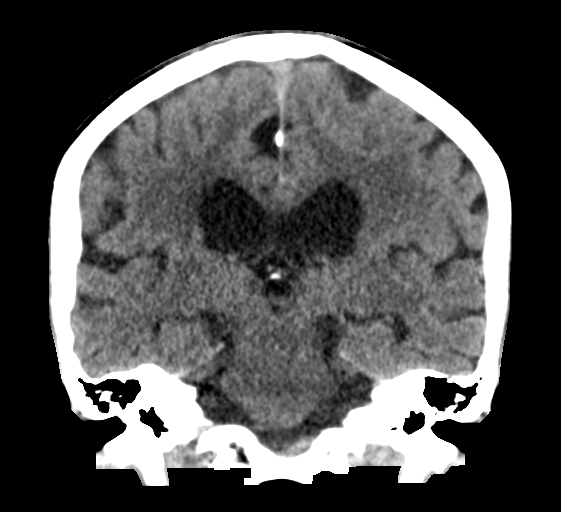
[im 35/63  brain]
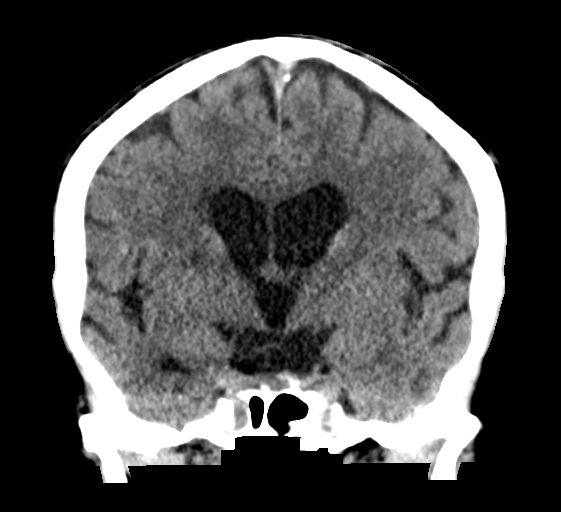

[Series 5: sagittal soft · sagittal · 0.31mm/px · 3 of 57 slices shown]
[im 19/57  brain]
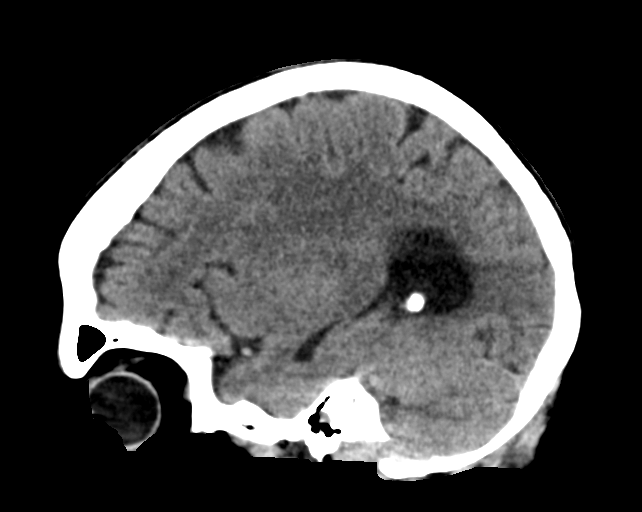
[im 29/57  brain]
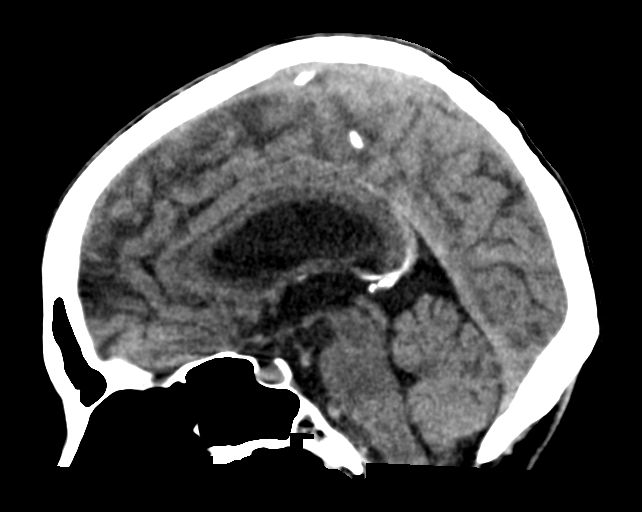
[im 38/57  brain]
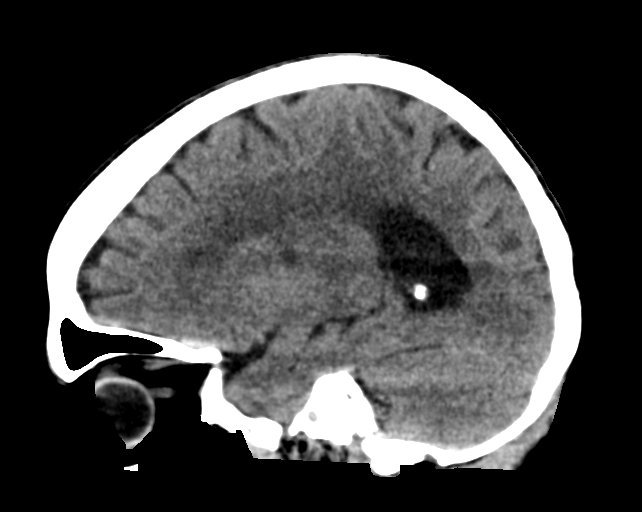

[16 of 47 positions shown; findings below may reference images not displayed]

FINDINGS: Brain: There is no acute intracranial hemorrhage, mass effect, or
edema. Gray-white differentiation is preserved. There is no
extra-axial fluid collection. Prominence of the ventricles and sulci
reflects stable parenchymal volume loss. Patchy hypoattenuation in
the supratentorial white matter is nonspecific but probably reflects
stable chronic microvascular ischemic changes.

Vascular: There is mild atherosclerotic calcification at the skull
base.

Skull: Calvarium is unremarkable.

Sinuses/Orbits: No acute finding.

Other: None.
IMPRESSION: No acute intracranial abnormality.

## 2022-07-08 IMAGING — DX DG CHEST 1V PORT
1 series · 1 of 1 positions shown · non-contrast
Comparison: December 26, 2021

CLINICAL DATA: weakness and loss of appetite

EXAM:
PORTABLE CHEST 1 VIEW

[chest ap]
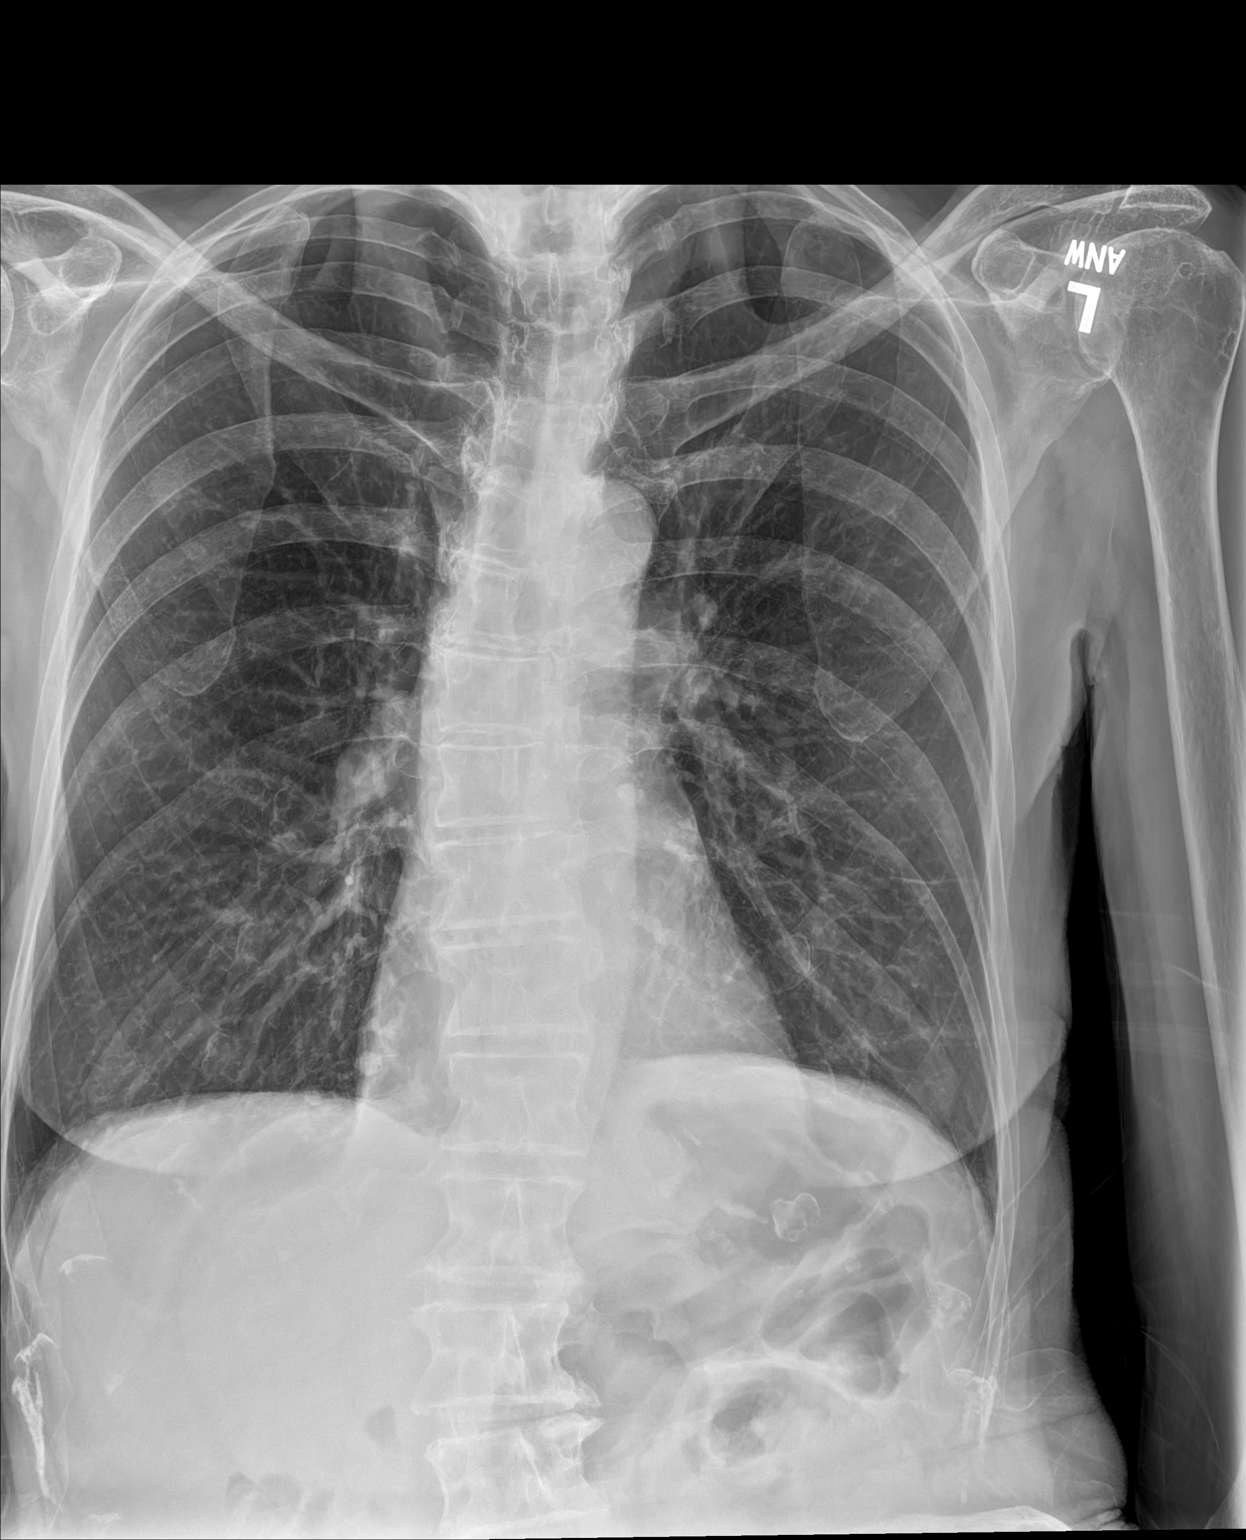

[1 of 1 positions shown; findings below may reference images not displayed]

FINDINGS: The heart size and mediastinal contours are within normal limits.
Both lungs are hyperinflated and clear. The visualized skeletal
structures are unremarkable.
IMPRESSION: No active disease.

## 2022-09-16 ENCOUNTER — Ambulatory Visit: Payer: Medicare HMO | Admitting: Psychiatry
